# Patient Record
Sex: Male | Born: 1965 | Hispanic: Yes | Marital: Married | Smoking: Former smoker
Health system: Southern US, Community
[De-identification: ages and names within clinical notes are randomized; demographics above are authoritative.]

## PROBLEM LIST (undated history)

## (undated) DIAGNOSIS — K922 Gastrointestinal hemorrhage, unspecified: Secondary | ICD-10-CM

## (undated) DIAGNOSIS — J449 Chronic obstructive pulmonary disease, unspecified: Secondary | ICD-10-CM

## (undated) DIAGNOSIS — I712 Thoracic aortic aneurysm, without rupture, unspecified: Secondary | ICD-10-CM

---

## 2019-01-31 ENCOUNTER — Other Ambulatory Visit: Payer: Self-pay

## 2019-01-31 DIAGNOSIS — Z20822 Contact with and (suspected) exposure to covid-19: Secondary | ICD-10-CM

## 2019-02-02 LAB — NOVEL CORONAVIRUS, NAA: SARS-CoV-2, NAA: NOT DETECTED

## 2019-02-03 ENCOUNTER — Telehealth: Payer: Self-pay | Admitting: *Deleted

## 2019-02-03 NOTE — Telephone Encounter (Signed)
Assisted by [facility], Interpreter # [phone]; pt's wife given result; she verbalized understanding.

## 2019-09-17 ENCOUNTER — Ambulatory Visit: Payer: BC Managed Care – PPO | Admitting: Emergency Medicine

## 2019-09-17 ENCOUNTER — Ambulatory Visit (INDEPENDENT_AMBULATORY_CARE_PROVIDER_SITE_OTHER): Payer: BC Managed Care – PPO

## 2019-09-17 ENCOUNTER — Other Ambulatory Visit: Payer: Self-pay

## 2019-09-17 ENCOUNTER — Encounter: Payer: Self-pay | Admitting: Emergency Medicine

## 2019-09-17 VITALS — BP 120/80 | HR 89 | Temp 98.6°F | Ht 72.0 in | Wt 209.0 lb

## 2019-09-17 DIAGNOSIS — F17200 Nicotine dependence, unspecified, uncomplicated: Secondary | ICD-10-CM

## 2019-09-17 DIAGNOSIS — Z7689 Persons encountering health services in other specified circumstances: Secondary | ICD-10-CM

## 2019-09-17 DIAGNOSIS — Z716 Tobacco abuse counseling: Secondary | ICD-10-CM | POA: Diagnosis not present

## 2019-09-17 DIAGNOSIS — F17209 Nicotine dependence, unspecified, with unspecified nicotine-induced disorders: Secondary | ICD-10-CM

## 2019-09-17 DIAGNOSIS — R634 Abnormal weight loss: Secondary | ICD-10-CM

## 2019-09-17 MED ORDER — BUPROPION HCL ER (SR) 150 MG PO TB12: 150.0000 mg | ORAL_TABLET | Freq: Every day | ORAL | 1 refills | Status: DC

## 2019-09-17 NOTE — Patient Instructions (Addendum)
If you have lab work done today you will be contacted with your lab results within the next 2 weeks.  If you have not heard from [place] then please contact us. The fastest way to get your results is to register for My Chart.   IF you received an x-ray today, you will receive an invoice from [facility]. Please contact [facility] at [phone] with questions or concerns regarding your invoice.   IF you received labwork today, you will receive an invoice from [facility]. Please contact [facility] at [phone] with questions or concerns regarding your invoice.   Our billing staff will not be able to assist you with questions regarding bills from these companies.  You will be contacted with the lab results as soon as they are available. The fastest way to get your results is to activate your My Chart account. Instructions are located on the last page of this paperwork. If you have not heard from [place] regarding the results in 2 weeks, please contact this office.      Mantenimiento de Teacher, English as a foreign language, Male Adoptar un estilo de vida saludable y recibir atencin preventiva son importantes para promover la salud y Musician. Consulte al mdico sobre:  El esquema adecuado para hacerse pruebas y exmenes peridicos.  Cosas que puede hacer por su cuenta para prevenir enfermedades y [place] sano. Qu debo saber sobre la dieta, el peso y el ejercicio? Consuma una dieta saludable   Consuma una dieta que incluya muchas verduras, frutas, productos lcteos con bajo contenido de [place] y Advertising account planner.  No consuma muchos alimentos ricos en grasas slidas, azcares agregados o sodio. Mantenga un peso saludable El ndice de masa muscular [facility]) es una medida que puede utilizarse para identificar posibles problemas de [place]. Proporciona una estimacin de la grasa corporal basndose en el peso y la altura. Su mdico puede ayudarle a Radiation protection practitioner [place] y a  Scientist, forensic o Theatre manager un peso saludable. Haga ejercicio con regularidad Haga ejercicio con regularidad. Esta es una de las prcticas ms importantes que puede hacer por su salud. La mayora de los adultos deben seguir estas pautas:  Optometrist, al menos, 169minutos de actividad fsica por semana. El ejercicio debe aumentar la frecuencia cardaca y Nature conservation officer transpirar (ejercicio de intensidad moderada).  Hacer ejercicios de fortalecimiento por lo [employer] por semana. Agregue esto a su plan de ejercicio de intensidad moderada.  Pasar menos tiempo sentados. Incluso la actividad fsica ligera puede ser beneficiosa. Controle sus niveles de colesterol y lpidos en la sangre Comience a realizarse anlisis de lpidos y Research officer, trade union en la sangre a los 20aos y luego reptalos cada 5aos. Es posible que Automotive engineer los niveles de colesterol con mayor frecuencia si:  Sus niveles de lpidos y colesterol son altos.  Es mayor de 40aos.  Presenta un alto riesgo de padecer enfermedades cardacas. Qu debo saber sobre las pruebas de deteccin del cncer? Muchos tipos de cncer pueden detectarse de manera temprana y, a menudo, pueden prevenirse. Segn su historia clnica y sus antecedentes familiares, es posible que deba realizarse pruebas de deteccin del cncer en diferentes edades. Esto puede incluir pruebas de deteccin de lo siguiente:  Surveyor, minerals.  Cncer de prstata.  Cncer de piel.  Cncer de pulmn. Qu debo saber sobre la enfermedad cardaca, la diabetes y la hipertensin arterial? Presin arterial y enfermedad cardaca  La hipertensin arterial causa enfermedades cardacas y [place] el riesgo de accidente cerebrovascular. Es ms probable  probable que esto se manifieste en las personas que tienen lecturas de presin arterial alta, tienen ascendencia africana o tienen sobrepeso.  Hable con el mdico sobre sus valores de presin arterial deseados.  Hgase controlar la presin  arterial: ? Cada 3 a 5 aos si tiene entre 18 y 39 aos. ? Todos los aos si es mayor de 40aos.  Si tiene entre 65 y 75 aos y es fumador o sola fumar, pregntele al mdico si debe realizarse una prueba de deteccin de aneurisma artico abdominal (AAA) por nica vez. Diabetes Realcese exmenes de deteccin de la diabetes con regularidad. Este anlisis revisa el nivel de azcar en la sangre en ayunas. Hgase las pruebas de deteccin:  Cada tresaos despus de los 45aos de edad si tiene un peso normal y un bajo riesgo de padecer diabetes.  Con ms frecuencia y a partir de una edad inferior si tiene sobrepeso o un alto riesgo de padecer diabetes. Qu debo saber sobre la prevencin de infecciones? Hepatitis B Si tiene un riesgo ms alto de contraer hepatitis B, debe someterse a un examen de deteccin de este virus. Hable con el mdico para averiguar si tiene riesgo de contraer la infeccin por hepatitis B. Hepatitis C Se recomienda un anlisis de sangre para:  Todos los que nacieron entre 1945 y 1965.  Todas las personas que tengan un riesgo de haber contrado hepatitis C. Enfermedades de transmisin sexual (ETS)  Debe realizarse pruebas de deteccin de ITS todos los aos, incluidas la gonorrea y la clamidia, si: ? Es sexualmente activo y es menor de 24aos. ? Es mayor de 24aos, y el mdico le informa que corre riesgo de tener este tipo de infecciones. ? La actividad sexual ha cambiado desde que le hicieron la ltima prueba de deteccin y tiene un riesgo mayor de tener clamidia o gonorrea. Pregntele al mdico si usted tiene riesgo.  Pregntele al mdico si usted tiene un alto riesgo de contraer VIH. El mdico tambin puede recomendarle un medicamento recetado para ayudar a evitar la infeccin por el VIH. Si elige tomar medicamentos para prevenir el VIH, primero debe hacerse los anlisis de deteccin del VIH. Luego debe hacerse anlisis cada 3meses mientras est tomando los  medicamentos. Siga estas instrucciones en su casa: Estilo de vida  No consuma ningn producto que contenga nicotina o tabaco, como cigarrillos, cigarrillos electrnicos y tabaco de mascar. Si necesita ayuda para dejar de fumar, consulte al mdico.  No consuma drogas.  No comparta agujas.  Solicite ayuda a su mdico si necesita apoyo o informacin para abandonar las drogas. Consumo de alcohol  No beba alcohol si el mdico se lo prohbe.  Si bebe alcohol: ? Limite la cantidad que consume de 0 a 2 medidas por da. ? Est atento a la cantidad de alcohol que hay en las bebidas que toma. En los [place], una medida equivale a una botella de cerveza de 12oz (355ml), un vaso de vino de 5oz (148ml) o un vaso de una bebida alcohlica de alta graduacin de 1oz (44ml). Instrucciones generales  Realcese los estudios de rutina de la salud, dentales y de la vista.  Mantngase al da con las vacunas.  Infrmele a su mdico si: ? Se siente deprimido con frecuencia. ? Alguna vez ha sido vctima de maltrato o no se siente seguro en su casa. Resumen  Adoptar un estilo de vida saludable y recibir atencin preventiva son importantes para promover la salud y el bienestar.  Siga las instrucciones del mdico   de una dieta saludable, el ejercicio y la realizacin de pruebas o exmenes para Hotel manager.  Siga las instrucciones del mdico con respecto al control del colesterol y la presin arterial. Esta informacin no tiene Theme park manager el consejo del mdico. Asegrese de hacerle al mdico cualquier pregunta que tenga. Document Revised: 03/27/2018 Document Reviewed: 03/27/2018 Elsevier Patient Education  2020 [employer].  Pasos para dejar de fumar Steps to Quit Smoking Fumar tabaco es la causa principal de muerte prevenible. Puede afectar a casi todos los rganos del cuerpo. Fumar aumenta el riesgo de sufrir muchas enfermedades graves y duraderas (crnicas), no  solo para el fumador, sino para las personas que lo rodean. Dejar de fumar puede ser difcil, pero es una de las mejores cosas que puede hacer por su salud. Nunca es muy tarde para dejar de fumar. Cmo me preparo para dejar de fumar? Cuando decida dejar de fumar, haga un plan que lo ayude a lograrlo. Antes de dejar de fumar, haga lo siguiente:  Elija una fecha para dejar de fumar. Fije una fecha que est dentro de las 2 semanas siguientes, as tiene tiempo para prepararse.  Escriba las razones por las que quiere dejar de Art therapist. Conserve esta lista en lugares donde la vea con frecuencia.  Dgales a sus familiares, amigos y compaeros de trabajo que est dejando de fumar. Su apoyo es importante.  Hable con su mdico sobre las opciones que pueden ayudarlo a dejar de fumar.  Averige si su seguro mdico le cubrir estos tratamientos.  Reconozca cules son las personas, los lugares, las cosas y las actividades que hacen que tenga deseos de fumar (factores desencadenantes). Evtelos. Cules son las primeras medidas que puedo tomar para dejar de fumar?  Deseche todos los cigarrillos que haya en su casa, en el trabajo y en su automvil.  Deseche todo lo que [place] cuando fuma, como ceniceros y encendedores.  Limpie su automvil. Asegrese de Presenter, broadcasting.  Limpie su casa, incluidas las cortinas y las alfombras. Qu puedo hacer que me ayude a dejar de fumar? Hable con su mdico sobre tomar [employer] y ver a Facilities manager psicolgico al [employer]. Es ms probable que tenga xito cuando hace ambas cosas.  Si est embarazada o amamantando, hable con su mdico sobre recibir asesoramiento psicolgico u otras maneras de dejar de fumar. No tome medicamentos para dejar de fumar a menos que el mdico se lo indique. Para dejar de fumar: Dejar de fumar inmediatamente  Deje de fumar por completo, en lugar de reducir lentamente la cantidad que fuma durante un perodo de [place].  Concurra al  asesoramiento psicolgico. Es ms probable que deje de fumar si concurre a las sesiones de asesoramiento psicolgico con regularidad. Tomar medicamentos Puede tomar medicamentos que lo ayuden a dejar de fumar. Algunos medicamentos necesitan receta y otros se pueden comprar sin receta. Algunos medicamentos pueden contener una droga llamada nicotina para reemplazar la nicotina de los cigarrillos. Los medicamentos pueden:  Ayudarlo a Animal nutritionist de sentir deseos de fumar (deseo intenso).  Ayudarlo a [employer] que aparecen cuando deja de fumar (sntomas de abstinencia). El mdico puede indicarle que use:  [place], goma de Theatre manager o parches de nicotina.  Inhaladores o aerosoles con nicotina.  Medicamentos sin nicotina que se toman por va oral. Buscar recursos Busque recursos y otras cosas que lo ayuden a Animal nutritionist de Art therapist, y a no Engineer, manufacturing systems hbito ms adelante. Estos recursos son ms tiles cuando se los [place] con frecuencia. Casandra Doffing  los siguientes:  Charlas en lnea con un consejero.  Lneas telefnicas para dejar de fumar.  Materiales impresos de [place].  Grupos de apoyo o asesoramiento psicolgico grupal.  Programas de mensajes de texto.  Aplicaciones para telfonos mviles. Use aplicaciones en su telfono mvil o tableta que lo ayuden a ajustarse al plan de dejar de fumar. Existen muchas aplicaciones gratuitas para telfonos mviles y tabletas, as Social research officer, government. Algunos ejemplos incluyen Quit Guide (Gua para dejar de fumar), [employer], y Nature conservation officer.gov  Qu puedo hacer para que dejar de fumar sea ms fcil?   Hable con sus familiares y amigos. Pdales que lo apoyen y [place].  Llame a una lnea telefnica que ayude a dejar de fumar (1-800-QUIT-NOW), pngase en contacto con grupos de apoyo o reciba asesoramiento psicolgico.  Pdale a la gente que fuma que no lo haga a su alrededor.  Evite los lugares que le den deseos de fumar, por  ejemplo: ? Bares. ? Fiestas. ? reas para fumar en el trabajo.  Pase tiempo con personas que no fumen.  Disminuya el estrs en su vida. El estrs puede hacer que desee fumar. Pruebe estas cosas para aliviar el estrs: ? Practicar actividad fsica con regularidad. ? Hacer ejercicios de respiracin profunda. ? Yoga. ? Meditar. ? Realizar una visualizacin corporal. Para hacer esto, cierre los ojos y focalcese en una zona del cuerpo a la vez desde la cabeza [employer] dedos del pie. Observe qu partes del cuerpo estn tensas. Trate de relajar los msculos de esas reas. Cmo me sentir cuando deje de fumar? Da 1 hasta 3 semanas En las primeras 24 horas, es posible que comience a tener algunos problemas que provienen de dejar de consumir tabaco. Estos problemas son muy intensos 2 o 3 das despus de dejar de fumar, pero no suelen durar ms de 2 o 3 semanas. Puede tener estos sntomas:  Cambios en el estado de nimo.  Sentirse inquieto, nervioso, enojado o contrariado.  Dificultad para concentrarse.  Mareos.  Fuerte deseo de consumir nicotina y alimentos con alto contenido de International aid/development worker.  Aumento de [place].  Dificultad para defecar (estreimiento).  Ganas de vomitar (nuseas).  Tos o dolor de Advertising copywriter.  Cambios en la forma en que actan en su cuerpo los medicamentos que toma para otros problemas.  Depresin.  Problemas para dormir (insomnio). Semana 3 y posteriormente Despus de 2o 3semanas de haber dejado de fumar, comenzar a notar resultados ms positivos, como los siguientes:  Mejor sentido del olfato y Event organiser.  Menos tos y dolor de Advertising copywriter.  Frecuencia cardaca ms lenta.  Presin arterial ms baja.  Piel ms tersa.  Mejor respiracin.  Menos das de enfermedad. Dejar de fumar puede ser difcil. No se rinda si la primera vez fracasa. Algunas personas necesitan probar algunas veces antes de lograrlo. Haga lo posible por respetar el plan para dejar de fumar y  hable con su mdico si tiene alguna pregunta o inquietud. Resumen  Fumar tabaco es la causa principal de muerte prevenible. Dejar de fumar puede ser difcil, pero es una de las mejores cosas que puede hacer por su salud.  Cuando decida dejar de fumar, haga un plan que lo ayude a lograrlo.  Deje de fumar de inmediato, no lentamente durante un perodo de [place].  Cuando comience a dejar de fumar, busque ayuda de su mdico, su familia o sus amigos. Esta informacin no tiene Theme park manager el consejo del mdico. Asegrese de hacerle al mdico cualquier pregunta que  tenga. Document Revised: 06/25/2018 Document Reviewed: 06/25/2018 Elsevier Patient Education  2020 [employer].

## 2019-09-17 NOTE — Progress Notes (Signed)
Scott Hanna 54 y.o.   Chief Complaint  Patient presents with  . Establish Care    general check up- and test for kidney and lungs     HISTORY OF PRESENT ILLNESS: This is a 54 y.o. male first visit to this office here to establish care with me. Recently moved to the area from [place]. Daily smoker for many years, pack per day.  Willing to stop. Occasional EtOH user. Physically active at work. No history of chronic medical problems.  On no medications. Reports unintentional weight loss over the past several months. No other complaints or medical concerns today.  HPI   Prior to Admission medications   Medication Sig Start Date End Date Taking? Authorizing Provider  aspirin EC 81 MG tablet Take 81 mg by mouth daily. Once he remembers   Yes [provider]    No Known Allergies  There are no problems to display for this patient.   History reviewed. No pertinent past medical history.  History reviewed. No pertinent surgical history.  Social History   Socioeconomic History  . Marital status: Married    Spouse name: Not on file  . Number of children: Not on file  . Years of education: Not on file  . Highest education level: Not on file  Occupational History  . Not on file  Tobacco Use  . Smoking status: Current Every Day Smoker  . Smokeless tobacco: Never Used  Substance and Sexual Activity  . Alcohol use: Not Currently  . Drug use: Never  . Sexual activity: Yes  Other Topics Concern  . Not on file  Social History Narrative  . Not on file   Social Determinants of Health   Financial Resource Strain:   . Difficulty of Paying Living Expenses:   Food Insecurity:   . Worried About Programme researcher, broadcasting/film/video in the Last Year:   . Barista in the Last Year:   Transportation Needs:   . Freight forwarder (Medical):   Marland Kitchen Lack of Transportation (Non-Medical):   Physical Activity:   . Days of Exercise per Week:   . Minutes of  Exercise per Session:   Stress:   . Feeling of Stress :   Social Connections:   . Frequency of Communication with Friends and Family:   . Frequency of Social Gatherings with Friends and Family:   . Attends Religious Services:   . Active Member of Clubs or Organizations:   . Attends Banker Meetings:   Marland Kitchen Marital Status:   Intimate Partner Violence:   . Fear of Current or Ex-Partner:   . Emotionally Abused:   Marland Kitchen Physically Abused:   . Sexually Abused:     History reviewed. No pertinent family history.   Review of Systems  Constitutional: Positive for weight loss. Negative for chills and fever.  HENT: Negative.  Negative for congestion and sore throat.   Respiratory: Negative.  Negative for cough, hemoptysis, sputum production, shortness of breath and wheezing.   Cardiovascular: Negative.  Negative for chest pain and palpitations.  Gastrointestinal: Negative.  Negative for abdominal pain, blood in stool, diarrhea, melena, nausea and vomiting.  Genitourinary: Negative.  Negative for dysuria and hematuria.  Musculoskeletal: Negative.  Negative for back pain, myalgias and neck pain.  Skin: Negative.  Negative for rash.  Neurological: Negative.  Negative for dizziness and headaches.  All other systems reviewed and are negative.  Today's Vitals   09/17/19 0959 09/17/19 1011  BP: Marland Kitchen)  145/95 120/80  Pulse: 89   Temp: 98.6 F (37 C)   TempSrc: Temporal   SpO2: 95%   Weight: 209 lb (94.8 kg)   Height: 6' (1.829 m)    Body mass index is 28.35 kg/m.   Physical Exam Vitals reviewed.  Constitutional:      Appearance: Normal appearance.  HENT:     Head: Normocephalic.     Mouth/Throat:     Mouth: Mucous membranes are moist.     Pharynx: Oropharynx is clear.  Eyes:     Extraocular Movements: Extraocular movements intact.     Conjunctiva/sclera: Conjunctivae normal.     Pupils: Pupils are equal, round, and reactive to light.  Cardiovascular:     Rate and Rhythm:  Normal rate and regular rhythm.     Pulses: Normal pulses.     Heart sounds: Normal heart sounds.  Pulmonary:     Effort: Pulmonary effort is normal.     Breath sounds: Normal breath sounds.  Abdominal:     General: There is no distension.     Palpations: Abdomen is soft. There is no mass.     Tenderness: There is no abdominal tenderness.  Musculoskeletal:        General: Normal range of motion.     Cervical back: Normal range of motion and neck supple.  Skin:    General: Skin is warm and dry.     Capillary Refill: Capillary refill takes less than 2 seconds.  Neurological:     General: No focal deficit present.     Mental Status: He is alert and oriented to person, place, and time.  Psychiatric:        Mood and Affect: Mood normal.        Behavior: Behavior normal.    DG Chest 2 View  Result Date: 09/17/2019 CLINICAL DATA:  Active smoker EXAM: CHEST - 2 VIEW COMPARISON:  None FINDINGS: Normal heart size, mediastinal contours, and pulmonary vascularity. Lungs hyperinflated with minimal peribronchial thickening suggesting COPD. No acute infiltrate, pleural effusion or pneumothorax. Levoconvex upper thoracic scoliosis. IMPRESSION: Hyperinflation and minimal peribronchial thickening question COPD. No acute abnormalities. Electronically Signed   By: Ulyses SouthwardMark  Boles M.D.   On: 09/17/2019 11:05   A total of 45 minutes was spent with the patient, greater than 50% of which was in counseling/coordination of care regarding medical history, need for smoking cessation, risks associated with smoking including cancer and cardiovascular diseases, chest x-ray findings and possibility of COPD, starting new medication Wellbutrin and side effects, diet and nutrition, health maintenance items, prognosis and need for follow-up.   ASSESSMENT & PLAN: [name] was seen today for establish care.  Diagnoses and all orders for this visit:  Loss of weight -     CBC with Differential/Platelet -     Hemoglobin  A1c -     Comprehensive metabolic panel -     TSH -     Lipid panel -     PSA  Current smoker -     DG Chest 2 View  Tobacco use disorder, continuous -     buPROPion (WELLBUTRIN SR) 150 MG 12 hr tablet; Take 1 tablet (150 mg total) by mouth daily.  Encounter for smoking cessation counseling  Encounter to establish care     Patient Instructions       If you have lab work done today you will be contacted with your lab results within the next 2 weeks.  If you have not heard  from [place] then please contact us. The fastest way to get your results is to register for My Chart.   IF you received an x-ray today, you will receive an invoice from [facility]. Please contact [facility] at [phone] with questions or concerns regarding your invoice.   IF you received labwork today, you will receive an invoice from [place]. Please contact [facility] at [phone] with questions or concerns regarding your invoice.   Our billing staff will not be able to assist you with questions regarding bills from these companies.  You will be contacted with the lab results as soon as they are available. The fastest way to get your results is to activate your My Chart account. Instructions are located on the last page of this paperwork. If you have not heard from [place] regarding the results in 2 weeks, please contact this office.      Mantenimiento de Research officer, political party, Male Adoptar un estilo de vida saludable y recibir atencin preventiva son importantes para promover la salud y Counsellor. Consulte al mdico sobre:  El esquema adecuado para hacerse pruebas y exmenes peridicos.  Cosas que puede hacer por su cuenta para prevenir enfermedades y [place] sano. Qu debo saber sobre la dieta, el peso y el ejercicio? Consuma una dieta saludable   Consuma una dieta que incluya muchas verduras, frutas, productos lcteos con bajo contenido de [place] y  Associate Professor.  No consuma muchos alimentos ricos en grasas slidas, azcares agregados o sodio. Mantenga un peso saludable El ndice de masa muscular [facility]) es una medida que puede utilizarse para identificar posibles problemas de [place]. Proporciona una estimacin de la grasa corporal basndose en el peso y la altura. Su mdico puede ayudarle a Engineer, site IMC y a Personnel officer o Pharmacologist un peso saludable. Haga ejercicio con regularidad Haga ejercicio con regularidad. Esta es una de las prcticas ms importantes que puede hacer por su salud. La mayora de los adultos deben seguir estas pautas:  Education officer, environmental, al menos, de actividad fsica por semana. El ejercicio debe aumentar la frecuencia cardaca y Media planner transpirar (ejercicio de intensidad moderada).  Hacer ejercicios de fortalecimiento por lo [employer] por semana. Agregue esto a su plan de ejercicio de intensidad moderada.  Pasar menos tiempo sentados. Incluso la actividad fsica ligera puede ser beneficiosa. Controle sus niveles de colesterol y lpidos en la sangre Comience a realizarse anlisis de lpidos y Oncologist en la sangre a los 20aos y luego reptalos cada 5aos. Es posible que Insurance underwriter los niveles de colesterol con mayor frecuencia si:  Sus niveles de lpidos y colesterol son altos.  Es mayor de 40aos.  Presenta un alto riesgo de padecer enfermedades cardacas. Qu debo saber sobre las pruebas de deteccin del cncer? Muchos tipos de cncer pueden detectarse de manera temprana y, a menudo, pueden prevenirse. Segn su historia clnica y sus antecedentes familiares, es posible que deba realizarse pruebas de deteccin del cncer en diferentes edades. Esto puede incluir pruebas de deteccin de lo siguiente:  Building services engineer.  Cncer de prstata.  Cncer de piel.  Cncer de pulmn. Qu debo saber sobre la enfermedad cardaca, la diabetes y la hipertensin arterial? Presin arterial y  enfermedad cardaca  La hipertensin arterial causa enfermedades cardacas y [place] el riesgo de accidente cerebrovascular. Es ms probable que esto se manifieste en las personas que tienen lecturas de presin arterial alta, tienen ascendencia africana o tienen sobrepeso.  Hable con el mdico sobre sus [place]  de presin arterial deseados.  Hgase controlar la presin arterial: ? Cada 3 a 5 aos si tiene entre 18 y 61 aos. ? Todos los aos si es mayor de [place].  Si tiene entre 65 y 82 aos y es fumador o Insurance underwriter, pregntele al mdico si debe realizarse una prueba de deteccin de aneurisma artico abdominal (AAA) por nica vez. Diabetes Realcese exmenes de deteccin de la diabetes con regularidad. Este anlisis revisa el nivel de azcar en la sangre en [place]. Hgase las pruebas de deteccin:  Cada tresaos despus de los 45aos de edad si tiene un peso normal y un bajo riesgo de padecer diabetes.  Con ms frecuencia y a partir de [place] edad inferior si tiene sobrepeso o un alto riesgo de padecer diabetes. Qu debo saber sobre la prevencin de infecciones? Hepatitis B Si tiene un riesgo ms alto de contraer hepatitis B, debe someterse a un examen de deteccin de este virus. Hable con el mdico para averiguar si tiene riesgo de contraer la infeccin por hepatitis B. Hepatitis C Se recomienda un anlisis de [place] para:  Todos los que nacieron entre 1947 y [phone].  Todas las personas que tengan un riesgo de haber contrado hepatitis C. Enfermedades de transmisin sexual (ETS)  Debe realizarse pruebas de deteccin de ITS todos los aos, incluidas la gonorrea y la clamidia, si: ? Es sexualmente activo y es menor de 24aos. ? Es mayor de 24aos, y Public affairs consultant informa que corre riesgo de tener este tipo de infecciones. ? La actividad sexual ha cambiado desde que le hicieron la ltima prueba de deteccin y tiene un riesgo mayor de Warehouse manager clamidia o Copy. Pregntele al mdico si  usted tiene riesgo.  Pregntele al mdico si usted tiene un alto riesgo de Primary school teacher VIH. El mdico tambin puede recomendarle un medicamento recetado para ayudar a evitar la infeccin por el VIH. Si elige tomar medicamentos para prevenir el VIH, primero debe [employer] de deteccin del VIH. Luego debe hacerse anlisis cada mientras est tomando los medicamentos. Siga estas instrucciones en su casa: Estilo de vida  No consuma ningn producto que contenga nicotina o tabaco, como cigarrillos, cigarrillos electrnicos y tabaco de Theatre manager. Si necesita ayuda para dejar de fumar, consulte al mdico.  No consuma drogas.  No comparta agujas.  Solicite ayuda a su mdico si necesita apoyo o informacin para abandonar las drogas. Consumo de alcohol  No beba alcohol si el mdico se lo prohbe.  Si bebe alcohol: ? Limite la cantidad que consume de 0 a 2 medidas por da. ? Est atento a la cantidad de alcohol que hay en las bebidas que toma. En los [place], una medida equivale a una botella de cerveza de 12oz ( ), un vaso de vino de 5oz ( ) o un vaso de una bebida alcohlica de alta graduacin de 1oz (44ml). Instrucciones generales  Realcese los estudios de rutina de la salud, dentales y de Wellsite geologist.  Mantngase al da con las vacunas.  Infrmele a su mdico si: ? Se siente deprimido con frecuencia. ? Alguna vez ha sido vctima de [place] o no se siente seguro en su casa. Resumen  Adoptar un estilo de vida saludable y recibir atencin preventiva son importantes para promover la salud y Counsellor.  Siga las instrucciones del mdico acerca de una dieta saludable, el ejercicio y la realizacin de pruebas o exmenes para Hotel manager.  Siga las instrucciones del mdico con respecto al control del colesterol y  la presin arterial. Esta informacin no tiene como fin reemplazar el consejo del mdico. Asegrese de hacerle al mdico cualquier pregunta  que tenga. Document Revised: 03/27/2018 Document Reviewed: 03/27/2018 Elsevier Patient Education  2020 [employer].  Pasos para dejar de fumar Steps to Quit Smoking Fumar tabaco es la causa principal de muerte prevenible. Puede afectar a casi todos los rganos del cuerpo. Fumar aumenta el riesgo de sufrir muchas enfermedades graves y duraderas (crnicas), no solo para el fumador, sino para las personas que lo rodean. Dejar de fumar puede ser difcil, pero es una de las mejores cosas que puede hacer por su salud. Nunca es muy tarde para dejar de fumar. Cmo me preparo para dejar de fumar? Cuando decida dejar de fumar, haga un plan que lo ayude a lograrlo. Antes de dejar de fumar, haga lo siguiente:  Elija una fecha para dejar de fumar. Fije una fecha que est dentro de las 2 semanas siguientes, as tiene tiempo para prepararse.  Escriba las razones por las que quiere dejar de Art therapist. Conserve esta lista en lugares donde la vea con frecuencia.  Dgales a sus familiares, amigos y compaeros de trabajo que est dejando de fumar. Su apoyo es importante.  Hable con su mdico sobre las opciones que pueden ayudarlo a dejar de fumar.  Averige si su seguro mdico le cubrir estos tratamientos.  Reconozca cules son las personas, los lugares, las cosas y las actividades que hacen que tenga deseos de fumar (factores desencadenantes). Evtelos. Cules son las primeras medidas que puedo tomar para dejar de fumar?  Deseche todos los cigarrillos que haya en su casa, en el trabajo y en su automvil.  Deseche todo lo que [place] cuando fuma, como ceniceros y encendedores.  Limpie su automvil. Asegrese de Presenter, broadcasting.  Limpie su casa, incluidas las cortinas y las alfombras. Qu puedo hacer que me ayude a dejar de fumar? Hable con su mdico sobre tomar [employer] y ver a Facilities manager psicolgico al [employer]. Es ms probable que tenga xito cuando hace ambas cosas.  Si est embarazada o  amamantando, hable con su mdico sobre recibir asesoramiento psicolgico u otras maneras de dejar de fumar. No tome medicamentos para dejar de fumar a menos que el mdico se lo indique. Para dejar de fumar: Dejar de fumar inmediatamente  Deje de fumar por completo, en lugar de reducir lentamente la cantidad que fuma durante un perodo de [place].  Concurra al asesoramiento psicolgico. Es ms probable que deje de fumar si concurre a las sesiones de asesoramiento psicolgico con regularidad. Tomar medicamentos Puede tomar medicamentos que lo ayuden a dejar de fumar. Algunos medicamentos necesitan receta y otros se pueden comprar sin receta. Algunos medicamentos pueden contener una droga llamada nicotina para reemplazar la nicotina de los cigarrillos. Los medicamentos pueden:  Ayudarlo a Animal nutritionist de sentir deseos de fumar (deseo intenso).  Ayudarlo a [employer] que aparecen cuando deja de fumar (sntomas de abstinencia). El mdico puede indicarle que use:  Pastillas, goma de Theatre manager o parches de nicotina.  Inhaladores o aerosoles con nicotina.  Medicamentos sin nicotina que se toman por va oral. Buscar recursos Busque recursos y otras cosas que lo ayuden a Animal nutritionist de Art therapist, y a no Engineer, manufacturing systems hbito ms adelante. Estos recursos son ms tiles cuando se los [place] con frecuencia. Incluyen los siguientes:  Charlas en lnea con un consejero.  Lneas telefnicas para dejar de fumar.  Materiales impresos de [place].  Grupos de apoyo o asesoramiento psicolgico grupal.  Programas de mensajes de texto.  Aplicaciones para telfonos mviles. Use aplicaciones en su telfono mvil o tableta que lo ayuden a ajustarse al plan de dejar de fumar. Existen muchas aplicaciones gratuitas para telfonos mviles y tabletas, as Social research officer, government. Algunos ejemplos incluyen Quit Guide (Gua para dejar de fumar), [employer], y Nature conservation officer.gov  Qu puedo hacer para que dejar de fumar sea ms  fcil?   Hable con sus familiares y amigos. Pdales que lo apoyen y [place].  Llame a una lnea telefnica que ayude a dejar de fumar (1-800-QUIT-NOW), pngase en contacto con grupos de apoyo o reciba asesoramiento psicolgico.  Pdale a la gente que fuma que no lo haga a su alrededor.  Evite los lugares que le den deseos de fumar, por ejemplo: ? Bares. ? Fiestas. ? reas para fumar en el trabajo.  Pase tiempo con personas que no fumen.  Disminuya el estrs en su vida. El estrs puede hacer que desee fumar. Pruebe estas cosas para aliviar el estrs: ? Practicar actividad fsica con regularidad. ? Hacer ejercicios de respiracin profunda. ? Yoga. ? Meditar. ? Realizar una visualizacin corporal. Para hacer esto, cierre los ojos y focalcese en una zona del cuerpo a la vez desde la cabeza [employer] dedos del pie. Observe qu partes del cuerpo estn tensas. Trate de relajar los msculos de esas reas. Cmo me sentir cuando deje de fumar? Da 1 hasta 3 semanas En las primeras 24 horas, es posible que comience a tener algunos problemas que provienen de dejar de consumir tabaco. Estos problemas son muy intensos 2 o 3 das despus de dejar de fumar, pero no suelen durar ms de 2 o 3 semanas. Puede tener estos sntomas:  Cambios en el estado de nimo.  Sentirse inquieto, nervioso, enojado o contrariado.  Dificultad para concentrarse.  Mareos.  Fuerte deseo de consumir nicotina y alimentos con alto contenido de International aid/development worker.  Aumento de [place].  Dificultad para defecar (estreimiento).  Ganas de vomitar (nuseas).  Tos o dolor de Advertising copywriter.  Cambios en la forma en que actan en su cuerpo los medicamentos que toma para otros problemas.  Depresin.  Problemas para dormir (insomnio). Semana 3 y posteriormente Despus de 2o 3semanas de haber dejado de fumar, comenzar a notar resultados ms positivos, como los siguientes:  Mejor sentido del olfato y Event organiser.  Menos tos y dolor  de Advertising copywriter.  Frecuencia cardaca ms lenta.  Presin arterial ms baja.  Piel ms tersa.  Mejor respiracin.  Menos das de enfermedad. Dejar de fumar puede ser difcil. No se rinda si la primera vez fracasa. Algunas personas necesitan probar algunas veces antes de lograrlo. Haga lo posible por respetar el plan para dejar de fumar y hable con su mdico si tiene alguna pregunta o inquietud. Resumen  Fumar tabaco es la causa principal de muerte prevenible. Dejar de fumar puede ser difcil, pero es una de las mejores cosas que puede hacer por su salud.  Cuando decida dejar de fumar, haga un plan que lo ayude a lograrlo.  Deje de fumar de inmediato, no lentamente durante un perodo de [place].  Cuando comience a dejar de fumar, busque ayuda de su mdico, su familia o sus amigos. Esta informacin no tiene Theme park manager el consejo del mdico. Asegrese de hacerle al mdico cualquier pregunta que tenga. Document Revised: 06/25/2018 Document Reviewed: 06/25/2018 Elsevier Patient Education  2020 Elsevier Inc.      Edwina Barth, MD [facility]

## 2019-09-18 ENCOUNTER — Other Ambulatory Visit: Payer: Self-pay | Admitting: Emergency Medicine

## 2019-09-18 ENCOUNTER — Telehealth: Payer: Self-pay | Admitting: *Deleted

## 2019-09-18 DIAGNOSIS — E785 Hyperlipidemia, unspecified: Secondary | ICD-10-CM

## 2019-09-18 LAB — CBC WITH DIFFERENTIAL/PLATELET
Basophils Absolute: 0.1 10*3/uL (ref 0.0–0.2)
Basos: 1 %
EOS (ABSOLUTE): 0.2 10*3/uL (ref 0.0–0.4)
Eos: 2 %
Hematocrit: 50.3 % (ref 37.5–51.0)
Hemoglobin: 16.6 g/dL (ref 13.0–17.7)
Immature Grans (Abs): 0 10*3/uL (ref 0.0–0.1)
Immature Granulocytes: 0 %
Lymphocytes Absolute: 2.8 10*3/uL (ref 0.7–3.1)
Lymphs: 34 %
MCH: 27.9 pg (ref 26.6–33.0)
MCHC: 33 g/dL (ref 31.5–35.7)
MCV: 85 fL (ref 79–97)
Monocytes Absolute: 1 10*3/uL — ABNORMAL HIGH (ref 0.1–0.9)
Monocytes: 11 %
Neutrophils Absolute: 4.3 10*3/uL (ref 1.4–7.0)
Neutrophils: 52 %
Platelets: 278 10*3/uL (ref 150–450)
RBC: 5.94 x10E6/uL — ABNORMAL HIGH (ref 4.14–5.80)
RDW: 12.6 % (ref 11.6–15.4)
WBC: 8.4 10*3/uL (ref 3.4–10.8)

## 2019-09-18 LAB — COMPREHENSIVE METABOLIC PANEL
ALT: 26 IU/L (ref 0–44)
AST: 17 IU/L (ref 0–40)
Albumin/Globulin Ratio: 1.4 (ref 1.2–2.2)
Albumin: 4.2 g/dL (ref 3.8–4.9)
Alkaline Phosphatase: 88 IU/L (ref 48–121)
BUN/Creatinine Ratio: 12 (ref 9–20)
BUN: 10 mg/dL (ref 6–24)
Bilirubin Total: 0.5 mg/dL (ref 0.0–1.2)
CO2: 25 mmol/L (ref 20–29)
Calcium: 9.6 mg/dL (ref 8.7–10.2)
Chloride: 102 mmol/L (ref 96–106)
Creatinine, Ser: 0.82 mg/dL (ref 0.76–1.27)
GFR calc Af Amer: 115 mL/min/{1.73_m2} (ref >59)
GFR calc non Af Amer: 100 mL/min/{1.73_m2} (ref >59)
Globulin, Total: 3.1 g/dL (ref 1.5–4.5)
Glucose: 94 mg/dL (ref 65–99)
Potassium: 4.7 mmol/L (ref 3.5–5.2)
Sodium: 142 mmol/L (ref 134–144)
Total Protein: 7.3 g/dL (ref 6.0–8.5)

## 2019-09-18 LAB — LIPID PANEL
Chol/HDL Ratio: 5.7 ratio — ABNORMAL HIGH (ref 0.0–5.0)
Cholesterol, Total: 200 mg/dL — ABNORMAL HIGH (ref 100–199)
HDL: 35 mg/dL — ABNORMAL LOW (ref >39)
LDL Chol Calc (NIH): 122 mg/dL — ABNORMAL HIGH (ref 0–99)
Triglycerides: 245 mg/dL — ABNORMAL HIGH (ref 0–149)
VLDL Cholesterol Cal: 43 mg/dL — ABNORMAL HIGH (ref 5–40)

## 2019-09-18 LAB — TSH: TSH: 1.33 u[IU]/mL (ref 0.450–4.500)

## 2019-09-18 LAB — PSA: Prostate Specific Ag, Serum: 1.1 ng/mL (ref 0.0–4.0)

## 2019-09-18 LAB — HEMOGLOBIN A1C
Est. average glucose Bld gHb Est-mCnc: 114 mg/dL
Hgb A1c MFr Bld: 5.6 % (ref 4.8–5.6)

## 2019-09-18 MED ORDER — ROSUVASTATIN CALCIUM 20 MG PO TABS: 20.0000 mg | ORAL_TABLET | Freq: Every day | ORAL | 3 refills | Status: DC

## 2019-09-18 NOTE — Telephone Encounter (Signed)
Called patient's mobile and home phone numbers using Interpreter [phone], unable to leave voice message because mail box not set up for the numbers. Also, no additional numbers to contact patient.

## 2019-09-19 ENCOUNTER — Telehealth: Payer: Self-pay

## 2019-09-19 ENCOUNTER — Other Ambulatory Visit: Payer: Self-pay

## 2019-09-19 DIAGNOSIS — E785 Hyperlipidemia, unspecified: Secondary | ICD-10-CM

## 2019-09-19 DIAGNOSIS — F17209 Nicotine dependence, unspecified, with unspecified nicotine-induced disorders: Secondary | ICD-10-CM

## 2019-09-19 MED ORDER — ROSUVASTATIN CALCIUM 20 MG PO TABS: 20.0000 mg | ORAL_TABLET | Freq: Every day | ORAL | 3 refills | Status: DC

## 2019-09-19 MED ORDER — BUPROPION HCL ER (SR) 150 MG PO TB12: 150.0000 mg | ORAL_TABLET | Freq: Every day | ORAL | 1 refills | Status: DC

## 2019-11-27 ENCOUNTER — Ambulatory Visit: Payer: BC Managed Care – PPO | Admitting: Emergency Medicine

## 2019-11-27 ENCOUNTER — Encounter: Payer: Self-pay | Admitting: Emergency Medicine

## 2019-11-27 ENCOUNTER — Other Ambulatory Visit: Payer: Self-pay

## 2019-11-27 VITALS — BP 132/77 | HR 80 | Temp 98.1°F | Resp 16 | Ht 72.0 in | Wt 214.0 lb

## 2019-11-27 DIAGNOSIS — E785 Hyperlipidemia, unspecified: Secondary | ICD-10-CM

## 2019-11-27 DIAGNOSIS — Z1211 Encounter for screening for malignant neoplasm of colon: Secondary | ICD-10-CM

## 2019-11-27 DIAGNOSIS — Z87891 Personal history of nicotine dependence: Secondary | ICD-10-CM

## 2019-11-27 NOTE — Progress Notes (Signed)
Scott Hanna 54 y.o.   Chief Complaint  Patient presents with  . Back Pain    lower - frequently long time per patient, and has stopped smoking    HISTORY OF PRESENT ILLNESS: This is a 54 y.o. male here for follow-up of visit last June. Stopped smoking.  Was started on Wellbutrin.  Doing very well.  Feels great. Blood work showed dyslipidemia.  Started on Crestor 20 mg daily.  Compliant with medication.  No side effects. Gets occasional left-sided mid back pain on and off for many years.  Not a significant concern for him. No longer having increase dark bronchial secretions. Gaining weight. No other complaints or medical concerns today. Recent blood work results reviewed with patient.  Normal PSA and TSH, normal CBC, normal CMP.  Abnormal lipid profile. The 10-year ASCVD risk score [name] DC Montez Hageman., et al., 2013) is: 8%   Values used to calculate the score:     Age: 50 years     Sex: Male     Is Non-Hispanic African American: No     Diabetic: No     Tobacco smoker: No     Systolic Blood Pressure: 132 mmHg     Is BP treated: No     HDL Cholesterol: 35 mg/dL     Total Cholesterol: 200 mg/dL   HPI   Prior to Admission medications   Medication Sig Start Date End Date Taking? Authorizing Provider  buPROPion (WELLBUTRIN SR) 150 MG 12 hr tablet Take 1 tablet (150 mg total) by mouth daily. 09/19/19 12/18/19 Yes Maynor Mwangi, Eilleen Kempf, MD  rosuvastatin (CRESTOR) 20 MG tablet Take 1 tablet (20 mg total) by mouth daily. 09/19/19  Yes Georgina Quint, MD  aspirin EC 81 MG tablet Take 81 mg by mouth daily. Once he remembers Patient not taking: Reported on 11/27/2019    [provider]    No Known Allergies  There are no problems to display for this patient.   History reviewed. No pertinent past medical history.  History reviewed. No pertinent surgical history.  Social History   Socioeconomic History  . Marital status: Married    Spouse name: Not on file  .  Number of children: Not on file  . Years of education: Not on file  . Highest education level: Not on file  Occupational History  . Not on file  Tobacco Use  . Smoking status: Former Games developer  . Smokeless tobacco: Never Used  Substance and Sexual Activity  . Alcohol use: Not Currently  . Drug use: Never  . Sexual activity: Yes  Other Topics Concern  . Not on file  Social History Narrative  . Not on file   Social Determinants of Health   Financial Resource Strain:   . Difficulty of Paying Living Expenses: Not on file  Food Insecurity:   . Worried About Programme researcher, broadcasting/film/video in the Last Year: Not on file  . Ran Out of Food in the Last Year: Not on file  Transportation Needs:   . Lack of Transportation (Medical): Not on file  . Lack of Transportation (Non-Medical): Not on file  Physical Activity:   . Days of Exercise per Week: Not on file  . Minutes of Exercise per Session: Not on file  Stress:   . Feeling of Stress : Not on file  Social Connections:   . Frequency of Communication with Friends and Family: Not on file  . Frequency of Social Gatherings with Friends and Family: Not  on file  . Attends Religious Services: Not on file  . Active Member of Clubs or Organizations: Not on file  . Attends Banker Meetings: Not on file  . Marital Status: Not on file  Intimate Partner Violence:   . Fear of Current or Ex-Partner: Not on file  . Emotionally Abused: Not on file  . Physically Abused: Not on file  . Sexually Abused: Not on file    History reviewed. No pertinent family history.   Review of Systems  Constitutional: Negative.  Negative for chills and fever.  HENT: Negative.  Negative for congestion and sore throat.   Respiratory: Negative.  Negative for cough and shortness of breath.   Cardiovascular: Negative.  Negative for chest pain and palpitations.  Gastrointestinal: Negative.  Negative for abdominal pain, diarrhea, nausea and vomiting.  Genitourinary:  Negative.  Negative for dysuria and hematuria.  Musculoskeletal: Negative.  Negative for back pain, myalgias and neck pain.  Skin: Negative.  Negative for rash.  Neurological: Negative.  Negative for dizziness and headaches.  All other systems reviewed and are negative.  Today's Vitals   11/27/19 0818  BP: 132/77  Pulse: 80  Resp: 16  Temp: 98.1 F (36.7 C)  TempSrc: Temporal  SpO2: 96%  Weight: 214 lb (97.1 kg)  Height: 6' (1.829 m)   Body mass index is 29.02 kg/m. Wt Readings from Last 3 Encounters:  11/27/19 214 lb (97.1 kg)  09/17/19 209 lb (94.8 kg)     Physical Exam Vitals reviewed.  Constitutional:      Appearance: Normal appearance.  HENT:     Head: Normocephalic.  Eyes:     Extraocular Movements: Extraocular movements intact.     Pupils: Pupils are equal, round, and reactive to light.  Cardiovascular:     Rate and Rhythm: Normal rate and regular rhythm.     Pulses: Normal pulses.     Heart sounds: Normal heart sounds.  Pulmonary:     Effort: Pulmonary effort is normal.     Breath sounds: Normal breath sounds.  Musculoskeletal:        General: No tenderness. Normal range of motion.     Cervical back: Normal, normal range of motion and neck supple.     Thoracic back: Normal.     Lumbar back: Normal.  Lymphadenopathy:     Cervical: No cervical adenopathy.  Skin:    General: Skin is warm and dry.     Capillary Refill: Capillary refill takes less than 2 seconds.  Neurological:     General: No focal deficit present.     Mental Status: He is alert and oriented to person, place, and time.  Psychiatric:        Mood and Affect: Mood normal.        Behavior: Behavior normal.      ASSESSMENT & PLAN: Kavontae was seen today for back pain.  Diagnoses and all orders for this visit:  Dyslipidemia  Colon cancer screening -     Ambulatory referral to Gastroenterology  Former smoker     Patient Instructions       If you have lab work done today  you will be contacted with your lab results within the next 2 weeks.  If you have not heard from [place] then please contact us. The fastest way to get your results is to register for My Chart.   IF you received an x-ray today, you will receive an invoice from [facility]. Please contact [facility]  Radiology at [phone] with questions or concerns regarding your invoice.   IF you received labwork today, you will receive an invoice from [facility]. Please contact [facility] at [phone] with questions or concerns regarding your invoice.   Our billing staff will not be able to assist you with questions regarding bills from these companies.  You will be contacted with the lab results as soon as they are available. The fastest way to get your results is to activate your My Chart account. Instructions are located on the last page of this paperwork. If you have not heard from [place] regarding the results in 2 weeks, please contact this office.     Mantenimiento de Research officer, political party, Male Adoptar un estilo de vida saludable y recibir atencin preventiva son importantes para promover la salud y Counsellor. Consulte al mdico sobre:  El esquema adecuado para hacerse pruebas y exmenes peridicos.  Cosas que puede hacer por su cuenta para prevenir enfermedades y [place] sano. Qu debo saber sobre la dieta, el peso y el ejercicio? Consuma una dieta saludable   Consuma una dieta que incluya muchas verduras, frutas, productos lcteos con bajo contenido de [place] y Associate Professor.  No consuma muchos alimentos ricos en grasas slidas, azcares agregados o sodio. Mantenga un peso saludable El ndice de masa muscular [facility]) es una medida que puede utilizarse para identificar posibles problemas de [place]. Proporciona una estimacin de la grasa corporal basndose en el peso y la altura. Su mdico puede ayudarle a Engineer, site [facility]MC y a Personnel officer o Pharmacologist un peso saludable. Haga  ejercicio con regularidad Haga ejercicio con regularidad. Esta es una de las prcticas ms importantes que puede hacer por su salud. La mayora de los adultos deben seguir estas pautas:  Education officer, environmental, al menos, de actividad fsica por semana. El ejercicio debe aumentar la frecuencia cardaca y Media planner transpirar (ejercicio de intensidad moderada).  Hacer ejercicios de fortalecimiento por lo [employer] por semana. Agregue esto a su plan de ejercicio de intensidad moderada.  Pasar menos tiempo sentados. Incluso la actividad fsica ligera puede ser beneficiosa. Controle sus niveles de colesterol y lpidos en la sangre Comience a realizarse anlisis de lpidos y Oncologist en la sangre a los 20aos y luego reptalos cada 5aos. Es posible que Insurance underwriter los niveles de colesterol con mayor frecuencia si:  Sus niveles de lpidos y colesterol son altos.  Es mayor de 40aos.  Presenta un alto riesgo de padecer enfermedades cardacas. Qu debo saber sobre las pruebas de deteccin del cncer? Muchos tipos de cncer pueden detectarse de manera temprana y, a menudo, pueden prevenirse. Segn su historia clnica y sus antecedentes familiares, es posible que deba realizarse pruebas de deteccin del cncer en diferentes edades. Esto puede incluir pruebas de deteccin de lo siguiente:  Building services engineer.  Cncer de prstata.  Cncer de piel.  Cncer de pulmn. Qu debo saber sobre la enfermedad cardaca, la diabetes y la hipertensin arterial? Presin arterial y enfermedad cardaca  La hipertensin arterial causa enfermedades cardacas y [place] el riesgo de accidente cerebrovascular. Es ms probable que esto se manifieste en las personas que tienen lecturas de presin arterial alta, tienen ascendencia africana o tienen sobrepeso.  Hable con el mdico sobre sus valores de presin arterial deseados.  Hgase controlar la presin arterial: ? Cada 3 a 5 aos si tiene entre 18  y 97 aos. ? Todos los aos si es mayor de [place].  Si tiene entre 65 y 70  aos y es fumador o sola fumar, pregntele al mdico si debe realizarse una prueba de deteccin de aneurisma artico abdominal (AAA) por nica vez. Diabetes Realcese exmenes de deteccin de la diabetes con regularidad. Este anlisis revisa el nivel de azcar en la sangre en [place]. Hgase las pruebas de deteccin:  Cada tresaos despus de los 45aos de edad si tiene un peso normal y un bajo riesgo de padecer diabetes.  Con ms frecuencia y a partir de [place] edad inferior si tiene sobrepeso o un alto riesgo de padecer diabetes. Qu debo saber sobre la prevencin de infecciones? Hepatitis B Si tiene un riesgo ms alto de contraer hepatitis B, debe someterse a un examen de deteccin de este virus. Hable con el mdico para averiguar si tiene riesgo de contraer la infeccin por hepatitis B. Hepatitis C Se recomienda un anlisis de [place] para:  Todos los que nacieron entre 1947 y [id number].  Todas las personas que tengan un riesgo de haber contrado hepatitis C. Enfermedades de transmisin sexual (ETS)  Debe realizarse pruebas de deteccin de ITS todos los aos, incluidas la gonorrea y la clamidia, si: ? Es sexualmente activo y es menor de 24aos. ? Es mayor de 24aos, y Public affairs consultant informa que corre riesgo de tener este tipo de infecciones. ? La actividad sexual ha cambiado desde que le hicieron la ltima prueba de deteccin y tiene un riesgo mayor de Warehouse manager clamidia o Copy. Pregntele al mdico si usted tiene riesgo.  Pregntele al mdico si usted tiene un alto riesgo de Primary school teacher VIH. El mdico tambin puede recomendarle un medicamento recetado para ayudar a evitar la infeccin por el VIH. Si elige tomar medicamentos para prevenir el VIH, primero debe [employer]OK de deteccin del VIH. Luego debe hacerse anlisis cada mientras est tomando los medicamentos. Siga estas instrucciones en su  casa: Estilo de vida  No consuma ningn producto que contenga nicotina o tabaco, como cigarrillos, cigarrillos electrnicos y tabaco de Theatre manager. Si necesita ayuda para dejar de fumar, consulte al mdico.  No consuma drogas.  No comparta agujas.  Solicite ayuda a su mdico si necesita apoyo o informacin para abandonar las drogas. Consumo de alcohol  No beba alcohol si el mdico se lo prohbe.  Si bebe alcohol: ? Limite la cantidad que consume de 0 a 2 medidas por da. ? Est atento a la cantidad de alcohol que hay en las bebidas que toma. En los [place], una medida equivale a una botella de cerveza de 12oz ( ), un vaso de vino de 5oz ( ) o un vaso de una bebida alcohlica de alta graduacin de 1oz (86ml). Instrucciones generales  Realcese los estudios de rutina de la salud, dentales y de Wellsite geologist.  Mantngase al da con las vacunas.  Infrmele a su mdico si: ? Se siente deprimido con frecuencia. ? Alguna vez ha sido vctima de [place] o no se siente seguro en su casa. Resumen  Adoptar un estilo de vida saludable y recibir atencin preventiva son importantes para promover la salud y Counsellor.  Siga las instrucciones del mdico acerca de una dieta saludable, el ejercicio y la realizacin de pruebas o exmenes para Hotel manager.  Siga las instrucciones del mdico con respecto al control del colesterol y la presin arterial. Esta informacin no tiene Theme park manager el consejo del mdico. Asegrese de hacerle al mdico cualquier pregunta que tenga. Document Revised: 03/27/2018 Document Reviewed: 03/27/2018 Elsevier Patient Education  2020 Elsevier Inc.  Dislipidemia Dyslipidemia  La dislipidemia es un desequilibrio de sustancias cerosas parecidas a la grasa (lpidos) en la sangre. El cuerpo necesita lpidos en pequeas cantidades. Con frecuencia, la dislipidemia implica un nivel alto de colesterol o triglicridos, que son tipos de  lpidos. Las formas frecuentes de dislipidemia incluyen las siguientes:  Niveles elevados de colesterol LDL. El LDL es el tipo de colesterol que causa la acumulacin de depsitos de grasa (placas) en los vasos sanguneos que transportan la sangre fuera del corazn (arterias).  Niveles bajos de colesterol HDL. El HDL es el tipo de colesterol que brinda proteccin contra las enfermedades cardacas. Los niveles altos de HDL eliminan la acumulacin de LDL de las arterias.  Niveles altos de triglicridos. Los triglicridos son Neomia Dear sustancia grasa presente en la sangre que se relaciona con la acumulacin de placa en las arterias. Cules son las causas? La dislipidemia primaria es causada por cambios (mutaciones) en los genes que se transmiten a travs de las familias (se heredan). Estas mutaciones causan varios tipos de dislipidemia. La dislipidemia secundaria es causada por elecciones de estilos de vida y enfermedades que ocasionan dislipidemia, tales como:  Seguir una dieta rica en grasa de origen animal.  No hacer suficiente ejercicio fsico.  Tener diabetes, enfermedad renal, enfermedad heptica o enfermedad tiroidea.  Beber grandes cantidades de alcohol.  Tomar ciertos medicamentos. Qu incrementa el riesgo? Tiene ms probabilidades de Aeronautical engineer afeccin si es un hombre mayor o si es una mujer que ha pasado por la menopausia. Otros factores de riesgo son los siguientes:  Tener antecedentes familiares de dislipidemia.  Tomar determinados medicamentos, entre ellos, pldoras anticonceptivas, corticoesteroides, algunos diurticos y betabloqueantes.  Fumar cigarrillos.  Consumir una dieta rica en grasas.  Tener ciertas afecciones mdicas, como diabetes, sndrome de ovario poliqustico (SOP), enfermedad renal, enfermedad heptica o hipotiroidismo.  No hacer ejercicio regularmente.  Tener sobrepeso o ser obeso con demasiada grasa en el abdomen. Cules son los signos o los  sntomas? En la [employer], la dislipidemia no causa ningn sntoma. En los [place] graves, los niveles muy altos de lpidos pueden causar:  Protuberancias de grasa debajo de la piel (xantomas).  Un anillo blanco o gris alrededor del centro negro (pupila) del ojo. Los niveles muy altos de triglicridos pueden causar inflamacin del pncreas (pancreatitis). Cmo se diagnostica? Su mdico puede diagnosticar dislipidemia basndose en un anlisis de sangre de rutina (anlisis de sangre en [place]). Como la [employer]son de las personas no tienen sntomas de la afeccin, este anlisis de sangre (perfil de lpidos) se realiza en adultos mayores de 20 aos y se repite cada 5 aos. En este anlisis, se controla lo siguiente:  Colesterol total. Esto mide la cantidad total de colesterol en la sangre, que incluye el colesterol LDL, el colesterol HDL y los triglicridos. Un valor saludable es inferior a 200.  Colesterol LDL. El valor objetivo de colesterol LDL es diferente para cada persona, en funcin de los factores de riesgo individuales. Consulte al mdico cul debe ser el valor del colesterol LDL para usted.  Colesterol HDL. Un nivel de colesterol HDL de 60 o superior es lo mejor porque ayuda a Health visitor las enfermedades cardacas. Un valor inferior a 40 en los hombres o inferior a 50 en las mujeres aumenta el riesgo de enfermedades cardacas.  Triglicridos. Un valor saludable de triglicridos es inferior a 150. Si su perfil de lpidos es anormal, su mdico puede realizar otros anlisis de [place]. Cmo se trata? El tratamiento depende del tipo de dislipidemia que  usted tenga y sus otros factores de riesgo de enfermedades cardacas o accidente cerebrovascular. Su mdico tendr un rango objetivo para sus niveles de lpidos en funcin de esta informacin. Para muchas personas, esta afeccin puede tratarse con cambios en el estilo de vida, tales como dieta y ejercicio. El mdico podra  recomendarle que haga lo siguiente:  Hacer ejercicio con regularidad.  Realizar cambios en la dieta.  Si fuma, dejar de hacerlo. Si los cambios en la dieta y la actividad fsica no ayudan a Baristaalcanzar sus objetivos, el mdico tambin puede recetarle medicamentos para disminuir los lpidos. El tipo de medicamento recetado con ms frecuencia disminuye el colesterol LDL (estatinas). Si tiene Publishing copyun nivel alto de triglicridos, su mdico puede recetarle otro tipo de frmaco (fibratos) o un suplemento de aceite de pescado con omega-3, o ambos. Siga estas indicaciones en su casa:  Comida y bebida  Siga las indicaciones del mdico o el nutricionista respecto de las restricciones para las comidas o las bebidas.  Siga una dieta saludable como se lo haya indicado el mdico. Esto puede ayudarle a Baristaalcanzar y Pharmacologistmantener un peso saludable, reducir el colesterol LDL y aumentar el colesterol HDL. Puede incluir: ? Limitar sus caloras, si tiene sobrepeso. ? Comer ms frutas, verduras, cereales integrales, pescado y carnes magras. ? Limitar las grasas saturadas, las grasas trans y Print production plannerel colesterol.  Si bebe alcohol: ? Limite la cantidad que Box Elderutiliza. ? Est atento a la cantidad de alcohol que hay en las bebidas que toma. En los [place], una medida equivale a una botella de cerveza de 12oz (355ml), un vaso de vino de 5oz (148ml) o un vaso de una bebida alcohlica de alta graduacin de 1oz (44ml).  No beba alcohol si: ? Su mdico le indica no hacerlo. ? Est embarazada, puede estar embarazada o est tratando de quedar embarazada. Actividad  Haga ejercicio con regularidad. Siga un programa de ejercicio y entrenamiento de fuerza tal como se lo haya indicado el mdico. Pregntele al mdico qu actividades son seguras para usted. El mdico puede recomendarle lo siguiente: ? 30 minutos de Kenyaactividad aerbica de 4 a 6 das por [address]semana. La caminata a paso ligero es un ejemplo de [place]seyactividad  aerbica. ? Entrenamiento de fuerza 2 Eli Lilly and Companydas por semana. Indicaciones generales  No consuma ningn producto que contenga nicotina o tabaco, como cigarrillos, cigarrillos electrnicos y tabaco de Theatre managermascar. Si necesita ayuda para dejar de fumar, consulte al mdico.  Baxter Internationalome los medicamentos de venta libre y los recetados solamente como se lo haya indicado el mdico. Esto incluye los suplementos.  Concurra a todas las visitas de seguimiento como se lo haya indicado el mdico. Comunquese con un mdico si:  Usted: ? Tiene dificultad para cumplir con su plan de actividad fsica o dieta. ? Le cuesta dejar de fumar o controlar el consumo de alcohol. Resumen  Con frecuencia, la dislipidemia implica un nivel alto de colesterol o triglicridos, que son tipos de lpidos.  El tratamiento depende del tipo de dislipidemia que usted tenga y sus otros factores de riesgo de enfermedades cardacas o accidente cerebrovascular.  Para muchas personas, el tratamiento comienza con cambios en el estilo de vida, tales como dieta y ejercicio.  Su mdico puede recetarle medicamentos para disminuir los lpidos. Esta informacin no tiene Theme park managercomo fin reemplazar el consejo del mdico. Asegrese de hacerle al mdico cualquier pregunta que tenga. Document Revised: 11/07/2017 Document Reviewed: 11/07/2017 Elsevier Patient Education  2020 Elsevier Inc.      Edwina BarthMiguel Wanza Szumski, MD [facility]

## 2019-11-27 NOTE — Patient Instructions (Addendum)
   If you have lab work done today you will be contacted with your lab results within the next 2 weeks.  If you have not heard from us then please contact us. The fastest way to get your results is to register for My Chart.   IF you received an x-ray today, you will receive an invoice from [facility]. Please contact [facility] at [phone] with questions or concerns regarding your invoice.   IF you received labwork today, you will receive an invoice from [facility]. Please contact [facility] at [phone] with questions or concerns regarding your invoice.   Our billing staff will not be able to assist you with questions regarding bills from these companies.  You will be contacted with the lab results as soon as they are available. The fastest way to get your results is to activate your My Chart account. Instructions are located on the last page of this paperwork. If you have not heard from us regarding the results in 2 weeks, please contact this office.       Mantenimiento de la salud en los hombres Health Maintenance, Male Adoptar un estilo de vida saludable y recibir atencin preventiva son importantes para promover la salud y el bienestar. Consulte al mdico sobre:  El esquema adecuado para hacerse pruebas y exmenes peridicos.  Cosas que puede hacer por su cuenta para prevenir enfermedades y mantenerse sano. Qu debo saber sobre la dieta, el peso y el ejercicio? Consuma una dieta saludable   Consuma una dieta que incluya muchas verduras, frutas, productos lcteos con bajo contenido de grasa y protenas magras.  No consuma muchos alimentos ricos en grasas slidas, azcares agregados o sodio. Mantenga un peso saludable El ndice de masa muscular (IMC) es una medida que puede utilizarse para identificar posibles problemas de peso. Proporciona una estimacin de la grasa corporal basndose en el peso y la altura. Su mdico puede ayudarle a determinar su IMC y  a lograr o mantener un peso saludable. Haga ejercicio con regularidad Haga ejercicio con regularidad. Esta es una de las prcticas ms importantes que puede hacer por su salud. La mayora de los adultos deben seguir estas pautas:  Realizar, al menos, 150minutos de actividad fsica por semana. El ejercicio debe aumentar la frecuencia cardaca y hacerlo transpirar (ejercicio de intensidad moderada).  Hacer ejercicios de fortalecimiento por lo menos dos veces por semana. Agregue esto a su plan de ejercicio de intensidad moderada.  Pasar menos tiempo sentados. Incluso la actividad fsica ligera puede ser beneficiosa. Controle sus niveles de colesterol y lpidos en la sangre Comience a realizarse anlisis de lpidos y colesterol en la sangre a los 20aos y luego reptalos cada 5aos. Es posible que necesite controlar los niveles de colesterol con mayor frecuencia si:  Sus niveles de lpidos y colesterol son altos.  Es mayor de 40aos.  Presenta un alto riesgo de padecer enfermedades cardacas. Qu debo saber sobre las pruebas de deteccin del cncer? Muchos tipos de cncer pueden detectarse de manera temprana y, a menudo, pueden prevenirse. Segn su historia clnica y sus antecedentes familiares, es posible que deba realizarse pruebas de deteccin del cncer en diferentes edades. Esto puede incluir pruebas de deteccin de lo siguiente:  Cncer colorrectal.  Cncer de prstata.  Cncer de piel.  Cncer de pulmn. Qu debo saber sobre la enfermedad cardaca, la diabetes y la hipertensin arterial? Presin arterial y enfermedad cardaca  La hipertensin arterial causa enfermedades cardacas y aumenta el riesgo de accidente cerebrovascular. Es ms   probable que esto se manifieste en las personas que tienen lecturas de presin arterial alta, tienen ascendencia africana o tienen sobrepeso.  Hable con el mdico sobre sus valores de presin arterial deseados.  Hgase controlar la presin  arterial: ? Cada 3 a 5 aos si tiene entre 18 y 39 aos. ? Todos los aos si es mayor de 40aos.  Si tiene entre 65 y 75 aos y es fumador o sola fumar, pregntele al mdico si debe realizarse una prueba de deteccin de aneurisma artico abdominal (AAA) por nica vez. Diabetes Realcese exmenes de deteccin de la diabetes con regularidad. Este anlisis revisa el nivel de azcar en la sangre en ayunas. Hgase las pruebas de deteccin:  Cada tresaos despus de los 45aos de edad si tiene un peso normal y un bajo riesgo de padecer diabetes.  Con ms frecuencia y a partir de una edad inferior si tiene sobrepeso o un alto riesgo de padecer diabetes. Qu debo saber sobre la prevencin de infecciones? Hepatitis B Si tiene un riesgo ms alto de contraer hepatitis B, debe someterse a un examen de deteccin de este virus. Hable con el mdico para averiguar si tiene riesgo de contraer la infeccin por hepatitis B. Hepatitis C Se recomienda un anlisis de sangre para:  Todos los que nacieron entre 1945 y 1965.  Todas las personas que tengan un riesgo de haber contrado hepatitis C. Enfermedades de transmisin sexual (ETS)  Debe realizarse pruebas de deteccin de ITS todos los aos, incluidas la gonorrea y la clamidia, si: ? Es sexualmente activo y es menor de 24aos. ? Es mayor de 24aos, y el mdico le informa que corre riesgo de tener este tipo de infecciones. ? La actividad sexual ha cambiado desde que le hicieron la ltima prueba de deteccin y tiene un riesgo mayor de tener clamidia o gonorrea. Pregntele al mdico si usted tiene riesgo.  Pregntele al mdico si usted tiene un alto riesgo de contraer VIH. El mdico tambin puede recomendarle un medicamento recetado para ayudar a evitar la infeccin por el VIH. Si elige tomar medicamentos para prevenir el VIH, primero debe hacerse los anlisis de deteccin del VIH. Luego debe hacerse anlisis cada 3meses mientras est tomando los  medicamentos. Siga estas instrucciones en su casa: Estilo de vida  No consuma ningn producto que contenga nicotina o tabaco, como cigarrillos, cigarrillos electrnicos y tabaco de mascar. Si necesita ayuda para dejar de fumar, consulte al mdico.  No consuma drogas.  No comparta agujas.  Solicite ayuda a su mdico si necesita apoyo o informacin para abandonar las drogas. Consumo de alcohol  No beba alcohol si el mdico se lo prohbe.  Si bebe alcohol: ? Limite la cantidad que consume de 0 a 2 medidas por da. ? Est atento a la cantidad de alcohol que hay en las bebidas que toma. En los [place], una medida equivale a una botella de cerveza de 12oz (355ml), un vaso de vino de 5oz (148ml) o un vaso de una bebida alcohlica de alta graduacin de 1oz (44ml). Instrucciones generales  Realcese los estudios de rutina de la salud, dentales y de la vista.  Mantngase al da con las vacunas.  Infrmele a su mdico si: ? Se siente deprimido con frecuencia. ? Alguna vez ha sido vctima de maltrato o no se siente seguro en su casa. Resumen  Adoptar un estilo de vida saludable y recibir atencin preventiva son importantes para promover la salud y el bienestar.  Siga las instrucciones del mdico   una dieta saludable, el ejercicio y la realizacin de pruebas o exmenes para Hotel manager.  Siga las instrucciones del mdico con respecto al control del colesterol y la presin arterial. Esta informacin no tiene Theme park manager el consejo del mdico. Asegrese de hacerle al mdico cualquier pregunta que tenga. Document Revised: 03/27/2018 Document Reviewed: 03/27/2018 Elsevier Patient Education  2020 Elsevier Inc.  Dislipidemia Dyslipidemia La dislipidemia es un desequilibrio de sustancias cerosas parecidas a la grasa (lpidos) en la [place]. El cuerpo necesita lpidos en pequeas cantidades. Con frecuencia, la dislipidemia implica un nivel alto de  colesterol o triglicridos, que son tipos de lpidos. Las formas frecuentes de dislipidemia incluyen las siguientes:  Niveles elevados de colesterol LDL. El LDL es el tipo de colesterol que causa la acumulacin de depsitos de grasa (placas) en los vasos sanguneos que transportan la sangre fuera del corazn (arterias).  Niveles bajos de colesterol HDL. El HDL es el tipo de colesterol que brinda proteccin contra las enfermedades cardacas. Los niveles altos de HDL eliminan la acumulacin de LDL de las arterias.  Niveles altos de triglicridos. Los triglicridos son Neomia Dear sustancia grasa presente en la sangre que se relaciona con la acumulacin de placa en las arterias. Cules son las causas? La dislipidemia primaria es causada por cambios (mutaciones) en los genes que se transmiten a travs de las familias (se heredan). Estas mutaciones causan varios tipos de dislipidemia. La dislipidemia secundaria es causada por elecciones de estilos de vida y enfermedades que ocasionan dislipidemia, tales como:  Seguir una dieta rica en grasa de origen animal.  No hacer suficiente ejercicio fsico.  Tener diabetes, enfermedad renal, enfermedad heptica o enfermedad tiroidea.  Beber grandes cantidades de alcohol.  Tomar ciertos medicamentos. Qu incrementa el riesgo? Tiene ms probabilidades de Aeronautical engineer afeccin si es un hombre mayor o si es una mujer que ha pasado por la menopausia. Otros factores de riesgo son los siguientes:  Tener antecedentes familiares de dislipidemia.  Tomar determinados medicamentos, entre ellos, pldoras anticonceptivas, corticoesteroides, algunos diurticos y betabloqueantes.  Fumar cigarrillos.  Consumir una dieta rica en grasas.  Tener ciertas afecciones mdicas, como diabetes, sndrome de ovario poliqustico (SOP), enfermedad renal, enfermedad heptica o hipotiroidismo.  No hacer ejercicio regularmente.  Tener sobrepeso o ser obeso con demasiada grasa en el  abdomen. Cules son los signos o los sntomas? En la [employer], la dislipidemia no causa ningn sntoma. En los [place] graves, los niveles muy altos de lpidos pueden causar:  Protuberancias de grasa debajo de la piel (xantomas).  Un anillo blanco o gris alrededor del centro negro (pupila) del ojo. Los niveles muy altos de triglicridos pueden causar inflamacin del pncreas (pancreatitis). Cmo se diagnostica? Su mdico puede diagnosticar dislipidemia basndose en un anlisis de sangre de rutina (anlisis de sangre en [place]). Como la [employer] de las personas no tienen sntomas de la afeccin, este anlisis de sangre (perfil de lpidos) se realiza en adultos mayores de 20 aos y se repite cada 5 aos. En este anlisis, se controla lo siguiente:  Colesterol total. Esto mide la cantidad total de colesterol en la sangre, que incluye el colesterol LDL, el colesterol HDL y los triglicridos. Un valor saludable es inferior a 200.  Colesterol LDL. El valor objetivo de colesterol LDL es diferente para cada persona, en funcin de los factores de riesgo individuales. Consulte al mdico cul debe ser el valor del colesterol LDL para usted.  Colesterol HDL. Un nivel de colesterol HDL de 60 o superior es lo  mejor porque ayuda a Health visitor las enfermedades cardacas. Un valor inferior a 40 en los hombres o inferior a 50 en las mujeres aumenta el riesgo de enfermedades cardacas.  Triglicridos. Un valor saludable de triglicridos es inferior a 150. Si su perfil de lpidos es anormal, su mdico puede realizar otros anlisis de [place]. Cmo se trata? El tratamiento depende del tipo de dislipidemia que usted tenga y sus otros factores de riesgo de enfermedades cardacas o accidente cerebrovascular. Su mdico tendr un rango objetivo para sus niveles de lpidos en funcin de esta informacin. Para muchas personas, esta afeccin puede tratarse con cambios en el estilo de vida, tales como dieta y  ejercicio. El mdico podra recomendarle que haga lo siguiente:  Hacer ejercicio con regularidad.  Realizar cambios en la dieta.  Si fuma, dejar de hacerlo. Si los cambios en la dieta y la actividad fsica no ayudan a Barista sus objetivos, el mdico tambin puede recetarle medicamentos para disminuir los lpidos. El tipo de medicamento recetado con ms frecuencia disminuye el colesterol LDL (estatinas). Si tiene Publishing copy de triglicridos, su mdico puede recetarle otro tipo de frmaco (fibratos) o un suplemento de aceite de pescado con omega-3, o ambos. Siga estas indicaciones en su casa:  Comida y bebida  Siga las indicaciones del mdico o el nutricionista respecto de las restricciones para las comidas o las bebidas.  Siga una dieta saludable como se lo haya indicado el mdico. Esto puede ayudarle a Barista y Pharmacologist un peso saludable, reducir el colesterol LDL y aumentar el colesterol HDL. Puede incluir: ? Limitar sus caloras, si tiene sobrepeso. ? Comer ms frutas, verduras, cereales integrales, pescado y carnes magras. ? Limitar las grasas saturadas, las grasas trans y Print production planner.  Si bebe alcohol: ? Limite la cantidad que [place]. ? Est atento a la cantidad de alcohol que hay en las bebidas que toma. En los [place], una medida equivale a una botella de cerveza de 12oz ( ), un vaso de vino de 5oz ( ) o un vaso de una bebida alcohlica de alta graduacin de 1oz (56ml).  No beba alcohol si: ? Su mdico le indica no hacerlo. ? Est embarazada, puede estar embarazada o est tratando de quedar embarazada. Actividad  Haga ejercicio con regularidad. Siga un programa de ejercicio y entrenamiento de fuerza tal como se lo haya indicado el mdico. Pregntele al mdico qu actividades son seguras para usted. El mdico puede recomendarle lo siguiente: ? 30 minutos de [place] de 4 a 6 das por [address] La caminata a paso ligero es un ejemplo de  [place]. ? Entrenamiento de fuerza 2 [employer]. Indicaciones generales  No consuma ningn producto que contenga nicotina o tabaco, como cigarrillos, cigarrillos electrnicos y tabaco de Theatre manager. Si necesita ayuda para dejar de fumar, consulte al mdico.  [employer] de venta libre y los recetados solamente como se lo haya indicado el mdico. Esto incluye los suplementos.  Concurra a todas las visitas de seguimiento como se lo haya indicado el mdico. Comunquese con un mdico si:  Usted: ? Tiene dificultad para cumplir con su plan de actividad fsica o dieta. ? Le cuesta dejar de fumar o controlar el consumo de alcohol. Resumen  Con frecuencia, la dislipidemia implica un nivel alto de colesterol o triglicridos, que son tipos de lpidos.  El tratamiento depende del tipo de dislipidemia que usted tenga y sus otros factores de riesgo de enfermedades cardacas o accidente cerebrovascular.  Para muchas personas, el  tratamiento comienza con cambios en el estilo de vida, tales como dieta y ejercicio.  Su mdico puede recetarle medicamentos para disminuir los lpidos. Esta informacin no tiene Theme park manager el consejo del mdico. Asegrese de hacerle al mdico cualquier pregunta que tenga. Document Revised: 11/07/2017 Document Reviewed: 11/07/2017 Elsevier Patient Education  2020 [employer].

## 2019-12-30 NOTE — Telephone Encounter (Signed)
na

## 2020-02-05 ENCOUNTER — Ambulatory Visit (INDEPENDENT_AMBULATORY_CARE_PROVIDER_SITE_OTHER): Payer: BC Managed Care – PPO | Admitting: Emergency Medicine

## 2020-02-05 ENCOUNTER — Other Ambulatory Visit: Payer: Self-pay

## 2020-02-05 ENCOUNTER — Encounter: Payer: Self-pay | Admitting: Emergency Medicine

## 2020-02-05 VITALS — BP 126/85 | HR 80 | Temp 98.1°F | Ht 72.0 in | Wt 216.0 lb

## 2020-02-05 DIAGNOSIS — J449 Chronic obstructive pulmonary disease, unspecified: Secondary | ICD-10-CM

## 2020-02-05 DIAGNOSIS — Z23 Encounter for immunization: Secondary | ICD-10-CM

## 2020-02-05 DIAGNOSIS — R1010 Upper abdominal pain, unspecified: Secondary | ICD-10-CM

## 2020-02-05 DIAGNOSIS — Z8719 Personal history of other diseases of the digestive system: Secondary | ICD-10-CM

## 2020-02-05 LAB — POCT URINALYSIS DIP (MANUAL ENTRY)
Bilirubin, UA: NEGATIVE
Blood, UA: NEGATIVE
Glucose, UA: NEGATIVE mg/dL
Ketones, POC UA: NEGATIVE mg/dL
Leukocytes, UA: NEGATIVE
Nitrite, UA: NEGATIVE
Protein Ur, POC: NEGATIVE mg/dL
Spec Grav, UA: >=1.03 — AB (ref 1.010–1.025)
Urobilinogen, UA: 0.2 E.U./dL
pH, UA: 5 (ref 5.0–8.0)

## 2020-02-05 NOTE — Progress Notes (Signed)
Scott Hanna 54 y.o.   Chief Complaint  Patient presents with  . Abdominal Pain    taking omeprazole for the pain for some yrs now, told some yrs ago that he needs to f/u with gastro  . Immunizations    wants to talk about the flu vaccine vs covid, did receive covid vaccine. Not sure why he needs both    HISTORY OF PRESENT ILLNESS: This is a 54 y.o. male had episode of gastritis 2 weeks ago which lasted 2 to 3 days and is now better.  Asymptomatic at present time. Had similar episode years ago.  Took milk of magnesia and omeprazole with relief. Fully vaccinated against Covid.  Needs influenza vaccine. No other complaints or medical concerns today. Has history of COPD.  Chest x-ray done earlier this year reviewed with patient.  Stopped smoking and exercise tolerance has improved.  Asymptomatic.  HPI   Prior to Admission medications   Medication Sig Start Date End Date Taking? Authorizing Provider  aspirin EC 81 MG tablet Take 81 mg by mouth daily. Once he remembers   Yes [provider]  rosuvastatin (CRESTOR) 20 MG tablet Take 1 tablet (20 mg total) by mouth daily. 09/19/19  Yes Georgina Quint, MD  buPROPion [facility] SR) 150 MG 12 hr tablet Take 1 tablet (150 mg total) by mouth daily. 09/19/19 12/18/19  Georgina Quint, MD    No Known Allergies  There are no problems to display for this patient.   No past medical history on file.  No past surgical history on file.  Social History   Socioeconomic History  . Marital status: Married    Spouse name: Not on file  . Number of children: Not on file  . Years of education: Not on file  . Highest education level: Not on file  Occupational History  . Not on file  Tobacco Use  . Smoking status: Former Games developer  . Smokeless tobacco: Never Used  Substance and Sexual Activity  . Alcohol use: Not Currently  . Drug use: Never  . Sexual activity: Yes  Other Topics Concern  . Not on file  Social  History Narrative  . Not on file   Social Determinants of Health   Financial Resource Strain:   . Difficulty of Paying Living Expenses: Not on file  Food Insecurity:   . Worried About Programme researcher, broadcasting/film/video in the Last Year: Not on file  . Ran Out of Food in the Last Year: Not on file  Transportation Needs:   . Lack of Transportation (Medical): Not on file  . Lack of Transportation (Non-Medical): Not on file  Physical Activity:   . Days of Exercise per Week: Not on file  . Minutes of Exercise per Session: Not on file  Stress:   . Feeling of Stress : Not on file  Social Connections:   . Frequency of Communication with Friends and Family: Not on file  . Frequency of Social Gatherings with Friends and Family: Not on file  . Attends Religious Services: Not on file  . Active Member of Clubs or Organizations: Not on file  . Attends Banker Meetings: Not on file  . Marital Status: Not on file  Intimate Partner Violence:   . Fear of Current or Ex-Partner: Not on file  . Emotionally Abused: Not on file  . Physically Abused: Not on file  . Sexually Abused: Not on file    No family history on file.  Review of Systems  Constitutional: Negative.  Negative for chills and fever.  HENT: Negative.  Negative for congestion and sore throat.   Respiratory: Negative.  Negative for cough and shortness of breath.   Cardiovascular: Negative.  Negative for chest pain and palpitations.  Gastrointestinal: Negative.  Negative for abdominal pain, diarrhea, nausea and vomiting.  Genitourinary: Negative.  Negative for dysuria and hematuria.  Musculoskeletal: Negative.  Negative for back pain and myalgias.  Skin: Negative.  Negative for rash.  Neurological: Negative.  Negative for dizziness and headaches.  All other systems reviewed and are negative.  Today's Vitals   02/05/20 1102  BP: 126/85  Pulse: 80  Temp: 98.1 F (36.7 C)  SpO2: 96%  Weight: 216 lb (98 kg)  Height: 6' (1.829  m)   Body mass index is 29.29 kg/m.   Physical Exam Vitals reviewed.  Constitutional:      Appearance: He is well-developed.  HENT:     Head: Normocephalic.  Eyes:     Extraocular Movements: Extraocular movements intact.     Conjunctiva/sclera: Conjunctivae normal.     Pupils: Pupils are equal, round, and reactive to light.  Cardiovascular:     Rate and Rhythm: Normal rate and regular rhythm.     Pulses: Normal pulses.     Heart sounds: Normal heart sounds.  Pulmonary:     Effort: Pulmonary effort is normal.     Breath sounds: Normal breath sounds.  Abdominal:     General: Bowel sounds are normal. There is no distension.     Palpations: Abdomen is soft. There is no mass.     Tenderness: There is no abdominal tenderness. There is no right CVA tenderness, left CVA tenderness, guarding or rebound.  Musculoskeletal:        General: Normal range of motion.     Cervical back: Normal range of motion and neck supple.  Skin:    General: Skin is warm and dry.     Capillary Refill: Capillary refill takes less than 2 seconds.  Neurological:     General: No focal deficit present.     Mental Status: He is alert and oriented to person, place, and time.  Psychiatric:        Mood and Affect: Mood normal.        Behavior: Behavior normal.    A total of 30 minutes was spent with the patient, greater than 50% of which was in counseling/coordination of care regarding differential diagnosis of upper abdominal pain, management, review of all medications, ED precautions, review of most recent office visit notes, review of most recent blood work results, education on nutrition, review of most recent chest x-ray and findings of COPD, health maintenance items, documentation, prognosis and need for follow-up.   ASSESSMENT & PLAN: Clinically stable.  No medical concerns identified during this visit. Follow-up in 6 months. [name] was seen today for abdominal pain and immunizations.  Diagnoses and  all orders for this visit:  Pain of upper abdomen Comments: Resolved Orders: -     POCT urinalysis dipstick  Chronic obstructive pulmonary disease, unspecified COPD type (HCC)  History of gastritis  Need for influenza vaccination -     Flu Vaccine QUAD 36+ mos IM    Patient Instructions       If you have lab work done today you will be contacted with your lab results within the next 2 weeks.  If you have not heard from [place] then please contact us. The fastest way  to get your results is to register for My Chart.   IF you received an x-ray today, you will receive an invoice from [facility]. Please contact [facility] at [phone] with questions or concerns regarding your invoice.   IF you received labwork today, you will receive an invoice from [facility]. Please contact [facility] at [phone] with questions or concerns regarding your invoice.   Our billing staff will not be able to assist you with questions regarding bills from these companies.  You will be contacted with the lab results as soon as they are available. The fastest way to get your results is to activate your My Chart account. Instructions are located on the last page of this paperwork. If you have not heard from [place] regarding the results in 2 weeks, please contact this office.     Mantenimiento de Research officer, political party, Male Adoptar un estilo de vida saludable y recibir atencin preventiva son importantes para promover la salud y Counsellor. Consulte al mdico sobre:  El esquema adecuado para hacerse pruebas y exmenes peridicos.  Cosas que puede hacer por su cuenta para prevenir enfermedades y [place] sano. Qu debo saber sobre la dieta, el peso y el ejercicio? Consuma una dieta saludable   Consuma una dieta que incluya muchas verduras, frutas, productos lcteos con bajo contenido de [place] y Associate Professor.  No consuma muchos alimentos ricos en grasas  slidas, azcares agregados o sodio. Mantenga un peso saludable El ndice de masa muscular [facility]) es una medida que puede utilizarse para identificar posibles problemas de [place]. Proporciona una estimacin de la grasa corporal basndose en el peso y la altura. Su mdico puede ayudarle a Engineer, site IMC y a Personnel officer o Pharmacologist un peso saludable. Haga ejercicio con regularidad Haga ejercicio con regularidad. Esta es una de las prcticas ms importantes que puede hacer por su salud. La mayora de los adultos deben seguir estas pautas:  Education officer, environmental, al menos, de actividad fsica por semana. El ejercicio debe aumentar la frecuencia cardaca y Media planner transpirar (ejercicio de intensidad moderada).  Hacer ejercicios de fortalecimiento por lo [employer] por semana. Agregue esto a su plan de ejercicio de intensidad moderada.  Pasar menos tiempo sentados. Incluso la actividad fsica ligera puede ser beneficiosa. Controle sus niveles de colesterol y lpidos en la sangre Comience a realizarse anlisis de lpidos y Oncologist en la sangre a los 20aos y luego reptalos cada 5aos. Es posible que Insurance underwriter los niveles de colesterol con mayor frecuencia si:  Sus niveles de lpidos y colesterol son altos.  Es mayor de 40aos.  Presenta un alto riesgo de padecer enfermedades cardacas. Qu debo saber sobre las pruebas de deteccin del cncer? Muchos tipos de cncer pueden detectarse de manera temprana y, a menudo, pueden prevenirse. Segn su historia clnica y sus antecedentes familiares, es posible que deba realizarse pruebas de deteccin del cncer en diferentes edades. Esto puede incluir pruebas de deteccin de lo siguiente:  Building services engineer.  Cncer de prstata.  Cncer de piel.  Cncer de pulmn. Qu debo saber sobre la enfermedad cardaca, la diabetes y la hipertensin arterial? Presin arterial y enfermedad cardaca  La hipertensin arterial causa enfermedades  cardacas y [place] el riesgo de accidente cerebrovascular. Es ms probable que esto se manifieste en las personas que tienen lecturas de presin arterial alta, tienen ascendencia africana o tienen sobrepeso.  Hable con el mdico sobre sus valores de presin arterial deseados.  Hgase controlar la presin arterial: ?  Cada 3 a 5 aos si tiene entre 18 y 4039 aos. ? Todos los aos si es mayor de [place]40aos.  Si tiene entre 65 y 2375 aos y es fumador o Insurance underwritersola fumar, pregntele al mdico si debe realizarse una prueba de deteccin de aneurisma artico abdominal (AAA) por nica vez. Diabetes Realcese exmenes de deteccin de la diabetes con regularidad. Este anlisis revisa el nivel de azcar en la sangre en [place]. Hgase las pruebas de deteccin:  Cada tresaos despus de los 45aos de edad si tiene un peso normal y un bajo riesgo de padecer diabetes.  Con ms frecuencia y a partir de [place] ed[place] inferior si tiene sobrepeso o un alto riesgo de padecer diabetes. Qu debo saber sobre la prevencin de infecciones? Hepatitis B Si tiene un riesgo ms alto de contraer hepatitis B, debe someterse a un examen de deteccin de este virus. Hable con el mdico para averiguar si tiene riesgo de contraer la infeccin por hepatitis B. Hepatitis C Se recomienda un anlisis de [place] para:  Todos los que nacieron entre 1945 y [phone].  Todas las personas que tengan un riesgo de haber contrado hepatitis C. Enfermedades de transmisin sexual (ETS)  Debe realizarse pruebas de deteccin de ITS todos los aos, incluidas la gonorrea y la clamidia, si: ? Es sexualmente activo y es menor de 24aos. ? Es mayor de 24aos, y Public affairs consultantel mdico le informa que corre riesgo de tener este tipo de infecciones. ? La actividad sexual ha cambiado desde que le hicieron la ltima prueba de deteccin y tiene un riesgo mayor de Warehouse managertener clamidia o Copygonorrea. Pregntele al mdico si usted tiene riesgo.  Pregntele al mdico si usted tiene un alto  riesgo de Primary school teachercontraer VIH. El mdico tambin puede recomendarle un medicamento recetado para ayudar a evitar la infeccin por el VIH. Si elige tomar medicamentos para prevenir el VIH, primero debe ONEOKhacerse los anlisis de deteccin del VIH. Luego debe hacerse anlisis cada 3meses mientras est tomando los medicamentos. Siga estas instrucciones en su casa: Estilo de vida  No consuma ningn producto que contenga nicotina o tabaco, como cigarrillos, cigarrillos electrnicos y tabaco de Theatre managermascar. Si necesita ayuda para dejar de fumar, consulte al mdico.  No consuma drogas.  No comparta agujas.  Solicite ayuda a su mdico si necesita apoyo o informacin para abandonar las drogas. Consumo de alcohol  No beba alcohol si el mdico se lo prohbe.  Si bebe alcohol: ? Limite la cantidad que consume de 0 a 2 medidas por da. ? Est atento a la cantidad de alcohol que hay en las bebidas que toma. En [place], una medida equivale a una botella de cerveza de 12oz (355ml), un vaso de vino de 5oz (148ml) o un vaso de una bebida alcohlica de alta graduacin de 1oz (44ml). Instrucciones generales  Realcese los estudios de rutina de la salud, dentales y de Wellsite geologistla vista.  Mantngase al da con las vacunas.  Infrmele a su mdico si: ? Se siente deprimido con frecuencia. ? Alguna vez ha sido vctima de [place] o no se siente seguro en su casa. Resumen  Adoptar un estilo de vida saludable y recibir atencin preventiva son importantes para promover la salud y Counsellorel bienestar.  Siga las instrucciones del mdico acerca de una dieta saludable, el ejercicio y la realizacin de pruebas o exmenes para Hotel managerdetectar enfermedades.  Siga las instrucciones del mdico con respecto al control del colesterol y la presin arterial. Esta informacin no tiene Building services engineercomo fin reemplazar el  consejo del mdico. Asegrese de hacerle al mdico cualquier pregunta que tenga. Document Revised: 03/27/2018 Document Reviewed:  03/27/2018 Elsevier Patient Education  2020 Elsevier Inc.      Edwina Barth, MD [facility]

## 2020-02-05 NOTE — Patient Instructions (Addendum)
   If you have lab work done today you will be contacted with your lab results within the next 2 weeks.  If you have not heard from us then please contact us. The fastest way to get your results is to register for My Chart.   IF you received an x-ray today, you will receive an invoice from [facility]. Please contact [facility] at [phone] with questions or concerns regarding your invoice.   IF you received labwork today, you will receive an invoice from [facility]. Please contact [facility] at [phone] with questions or concerns regarding your invoice.   Our billing staff will not be able to assist you with questions regarding bills from these companies.  You will be contacted with the lab results as soon as they are available. The fastest way to get your results is to activate your My Chart account. Instructions are located on the last page of this paperwork. If you have not heard from us regarding the results in 2 weeks, please contact this office.       Mantenimiento de la salud en los hombres Health Maintenance, Male Adoptar un estilo de vida saludable y recibir atencin preventiva son importantes para promover la salud y el bienestar. Consulte al mdico sobre:  El esquema adecuado para hacerse pruebas y exmenes peridicos.  Cosas que puede hacer por su cuenta para prevenir enfermedades y mantenerse sano. Qu debo saber sobre la dieta, el peso y el ejercicio? Consuma una dieta saludable   Consuma una dieta que incluya muchas verduras, frutas, productos lcteos con bajo contenido de grasa y protenas magras.  No consuma muchos alimentos ricos en grasas slidas, azcares agregados o sodio. Mantenga un peso saludable El ndice de masa muscular (IMC) es una medida que puede utilizarse para identificar posibles problemas de peso. Proporciona una estimacin de la grasa corporal basndose en el peso y la altura. Su mdico puede ayudarle a determinar su IMC y  a lograr o mantener un peso saludable. Haga ejercicio con regularidad Haga ejercicio con regularidad. Esta es una de las prcticas ms importantes que puede hacer por su salud. La mayora de los adultos deben seguir estas pautas:  Realizar, al menos, 150minutos de actividad fsica por semana. El ejercicio debe aumentar la frecuencia cardaca y hacerlo transpirar (ejercicio de intensidad moderada).  Hacer ejercicios de fortalecimiento por lo menos dos veces por semana. Agregue esto a su plan de ejercicio de intensidad moderada.  Pasar menos tiempo sentados. Incluso la actividad fsica ligera puede ser beneficiosa. Controle sus niveles de colesterol y lpidos en la sangre Comience a realizarse anlisis de lpidos y colesterol en la sangre a los 20aos y luego reptalos cada 5aos. Es posible que necesite controlar los niveles de colesterol con mayor frecuencia si:  Sus niveles de lpidos y colesterol son altos.  Es mayor de 40aos.  Presenta un alto riesgo de padecer enfermedades cardacas. Qu debo saber sobre las pruebas de deteccin del cncer? Muchos tipos de cncer pueden detectarse de manera temprana y, a menudo, pueden prevenirse. Segn su historia clnica y sus antecedentes familiares, es posible que deba realizarse pruebas de deteccin del cncer en diferentes edades. Esto puede incluir pruebas de deteccin de lo siguiente:  Cncer colorrectal.  Cncer de prstata.  Cncer de piel.  Cncer de pulmn. Qu debo saber sobre la enfermedad cardaca, la diabetes y la hipertensin arterial? Presin arterial y enfermedad cardaca  La hipertensin arterial causa enfermedades cardacas y aumenta el riesgo de accidente cerebrovascular. Es ms   probable que esto se manifieste en las personas que tienen lecturas de presin arterial alta, tienen ascendencia africana o tienen sobrepeso.  Hable con el mdico sobre sus valores de presin arterial deseados.  Hgase controlar la presin  arterial: ? Cada 3 a 5 aos si tiene entre 18 y 39 aos. ? Todos los aos si es mayor de 40aos.  Si tiene entre 65 y 75 aos y es fumador o sola fumar, pregntele al mdico si debe realizarse una prueba de deteccin de aneurisma artico abdominal (AAA) por nica vez. Diabetes Realcese exmenes de deteccin de la diabetes con regularidad. Este anlisis revisa el nivel de azcar en la sangre en ayunas. Hgase las pruebas de deteccin:  Cada tresaos despus de los 45aos de edad si tiene un peso normal y un bajo riesgo de padecer diabetes.  Con ms frecuencia y a partir de una edad inferior si tiene sobrepeso o un alto riesgo de padecer diabetes. Qu debo saber sobre la prevencin de infecciones? Hepatitis B Si tiene un riesgo ms alto de contraer hepatitis B, debe someterse a un examen de deteccin de este virus. Hable con el mdico para averiguar si tiene riesgo de contraer la infeccin por hepatitis B. Hepatitis C Se recomienda un anlisis de sangre para:  Todos los que nacieron entre 1945 y 1965.  Todas las personas que tengan un riesgo de haber contrado hepatitis C. Enfermedades de transmisin sexual (ETS)  Debe realizarse pruebas de deteccin de ITS todos los aos, incluidas la gonorrea y la clamidia, si: ? Es sexualmente activo y es menor de 24aos. ? Es mayor de 24aos, y el mdico le informa que corre riesgo de tener este tipo de infecciones. ? La actividad sexual ha cambiado desde que le hicieron la ltima prueba de deteccin y tiene un riesgo mayor de tener clamidia o gonorrea. Pregntele al mdico si usted tiene riesgo.  Pregntele al mdico si usted tiene un alto riesgo de contraer VIH. El mdico tambin puede recomendarle un medicamento recetado para ayudar a evitar la infeccin por el VIH. Si elige tomar medicamentos para prevenir el VIH, primero debe hacerse los anlisis de deteccin del VIH. Luego debe hacerse anlisis cada 3meses mientras est tomando los  medicamentos. Siga estas instrucciones en su casa: Estilo de vida  No consuma ningn producto que contenga nicotina o tabaco, como cigarrillos, cigarrillos electrnicos y tabaco de mascar. Si necesita ayuda para dejar de fumar, consulte al mdico.  No consuma drogas.  No comparta agujas.  Solicite ayuda a su mdico si necesita apoyo o informacin para abandonar las drogas. Consumo de alcohol  No beba alcohol si el mdico se lo prohbe.  Si bebe alcohol: ? Limite la cantidad que consume de 0 a 2 medidas por da. ? Est atento a la cantidad de alcohol que hay en las bebidas que toma. En los [place], una medida equivale a una botella de cerveza de 12oz (355ml), un vaso de vino de 5oz (148ml) o un vaso de una bebida alcohlica de alta graduacin de 1oz (44ml). Instrucciones generales  Realcese los estudios de rutina de la salud, dentales y de la vista.  Mantngase al da con las vacunas.  Infrmele a su mdico si: ? Se siente deprimido con frecuencia. ? Alguna vez ha sido vctima de maltrato o no se siente seguro en su casa. Resumen  Adoptar un estilo de vida saludable y recibir atencin preventiva son importantes para promover la salud y el bienestar.  Siga las instrucciones del mdico   acerca de una dieta saludable, el ejercicio y la realizacin de pruebas o exmenes para detectar enfermedades.  Siga las instrucciones del mdico con respecto al control del colesterol y la presin arterial. Esta informacin no tiene como fin reemplazar el consejo del mdico. Asegrese de hacerle al mdico cualquier pregunta que tenga. Document Revised: 03/27/2018 Document Reviewed: 03/27/2018 Elsevier Patient Education  2020 Elsevier Inc.  

## 2020-05-03 DIAGNOSIS — Z23 Encounter for immunization: Secondary | ICD-10-CM | POA: Diagnosis not present

## 2020-05-03 DIAGNOSIS — S61203A Unspecified open wound of left middle finger without damage to nail, initial encounter: Secondary | ICD-10-CM | POA: Diagnosis not present

## 2020-05-17 DIAGNOSIS — Z4802 Encounter for removal of sutures: Secondary | ICD-10-CM | POA: Diagnosis not present

## 2020-05-26 ENCOUNTER — Ambulatory Visit: Payer: BC Managed Care – PPO | Admitting: Emergency Medicine

## 2020-05-26 ENCOUNTER — Encounter: Payer: Self-pay | Admitting: Emergency Medicine

## 2020-05-26 ENCOUNTER — Other Ambulatory Visit: Payer: Self-pay

## 2020-05-26 VITALS — BP 135/88 | HR 81 | Temp 98.2°F | Resp 16 | Ht 72.0 in | Wt 237.0 lb

## 2020-05-26 DIAGNOSIS — J449 Chronic obstructive pulmonary disease, unspecified: Secondary | ICD-10-CM

## 2020-05-26 DIAGNOSIS — M549 Dorsalgia, unspecified: Secondary | ICD-10-CM

## 2020-05-26 DIAGNOSIS — E785 Hyperlipidemia, unspecified: Secondary | ICD-10-CM | POA: Diagnosis not present

## 2020-05-26 DIAGNOSIS — Z87891 Personal history of nicotine dependence: Secondary | ICD-10-CM

## 2020-05-26 DIAGNOSIS — Z8719 Personal history of other diseases of the digestive system: Secondary | ICD-10-CM

## 2020-05-26 NOTE — Progress Notes (Signed)
Scott Hanna 55 y.o.   Chief Complaint  Patient presents with  . Medical Management of Chronic Issues    Follow up    HISTORY OF PRESENT ILLNESS: This is a 55 y.o. male here for follow-up of chronic medical problems. Has history of COPD, ex smoker.  Doing well.  No complaints. History of dyslipidemia on rosuvastatin 20 mg daily. Complaining of occasional left-sided mid back pain related to movement and activity. Also concerned about sperm count. No other complaints or medical concerns today.  HPI   Prior to Admission medications   Medication Sig Start Date End Date Taking? Authorizing Provider  aspirin EC 81 MG tablet Take 81 mg by mouth daily. Once he remembers   Yes [provider]  rosuvastatin (CRESTOR) 20 MG tablet Take 1 tablet (20 mg total) by mouth daily. 09/19/19  Yes Georgina Quint, MD  buPROPion [facility] SR) 150 MG 12 hr tablet Take 1 tablet (150 mg total) by mouth daily. 09/19/19 12/18/19  Georgina Quint, MD    No Known Allergies  Patient Active Problem List   Diagnosis Date Noted  . Chronic obstructive pulmonary disease (HCC) 02/05/2020    History reviewed. No pertinent past medical history.  History reviewed. No pertinent surgical history.  Social History   Socioeconomic History  . Marital status: Married    Spouse name: Not on file  . Number of children: Not on file  . Years of education: Not on file  . Highest education level: Not on file  Occupational History  . Not on file  Tobacco Use  . Smoking status: Former Games developer  . Smokeless tobacco: Never Used  Substance and Sexual Activity  . Alcohol use: Not Currently  . Drug use: Never  . Sexual activity: Yes  Other Topics Concern  . Not on file  Social History Narrative  . Not on file   Social Determinants of Health   Financial Resource Strain: Not on file  Food Insecurity: Not on file  Transportation Needs: Not on file  Physical Activity: Not on file   Stress: Not on file  Social Connections: Not on file  Intimate Partner Violence: Not on file    History reviewed. No pertinent family history.   Review of Systems  Constitutional: Negative.  Negative for chills and fever.  HENT: Negative.  Negative for congestion and sore throat.   Respiratory: Negative.  Negative for cough and shortness of breath.   Cardiovascular: Negative.  Negative for chest pain and palpitations.  Gastrointestinal: Negative.  Negative for abdominal pain, blood in stool, diarrhea, melena, nausea and vomiting.  Genitourinary: Negative.  Negative for dysuria and hematuria.  Musculoskeletal: Positive for back pain.  Skin: Negative.  Negative for rash.  Neurological: Negative.  Negative for dizziness and headaches.  All other systems reviewed and are negative.    Today's Vitals   05/26/20 0937  BP: 135/88  Pulse: 81  Resp: 16  Temp: 98.2 F (36.8 C)  TempSrc: Temporal  SpO2: 96%  Weight: 237 lb (107.5 kg)  Height: 6' (1.829 m)   Body mass index is 32.14 kg/m. Wt Readings from Last 3 Encounters:  05/26/20 237 lb (107.5 kg)  02/05/20 216 lb (98 kg)  11/27/19 214 lb (97.1 kg)     Physical Exam Vitals reviewed.  Constitutional:      Appearance: Normal appearance.  HENT:     Head: Normocephalic.  Eyes:     Extraocular Movements: Extraocular movements intact.     Conjunctiva/sclera:  Conjunctivae normal.     Pupils: Pupils are equal, round, and reactive to light.  Cardiovascular:     Rate and Rhythm: Normal rate.  Pulmonary:     Effort: Pulmonary effort is normal.  Abdominal:     Palpations: Abdomen is soft.     Tenderness: There is no abdominal tenderness.  Musculoskeletal:     Cervical back: Normal range of motion and neck supple.  Skin:    General: Skin is warm and dry.     Capillary Refill: Capillary refill takes less than 2 seconds.  Neurological:     General: No focal deficit present.     Mental Status: He is alert and oriented to  person, place, and time.  Psychiatric:        Mood and Affect: Mood normal.        Behavior: Behavior normal.    A total of 30 minutes was spent with the patient, greater than 50% of which was in counseling/coordination of care regarding multiple chronic medical problems and concerns, cardiovascular risks associated with these conditions, education on nutrition, advise regarding evaluation of sperm count and need for urology evaluation, review of all medications, review of most recent office visit notes, review of most recent blood work results, prognosis, documentation, need for follow-up.   ASSESSMENT & PLAN: Clinically stable.  No medical concerns identified during this visit. Continue present medications.  No changes. Follow-up in 6 months. Scott Hanna was seen today for medical management of chronic issues.  Diagnoses and all orders for this visit:  Chronic obstructive pulmonary disease, unspecified COPD type (HCC) -     Comprehensive metabolic panel -     DG Chest 2 View; Future  History of gastritis  Dyslipidemia -     Comprehensive metabolic panel -     Hemoglobin A1c -     Lipid panel  Former smoker  Musculoskeletal back pain    Patient Instructions       If you have lab work done today you will be contacted with your lab results within the next 2 weeks.  If you have not heard from [place] then please contact us. The fastest way to get your results is to register for My Chart.   IF you received an x-ray today, you will receive an invoice from [facility]. Please contact [facility] at [phone] with questions or concerns regarding your invoice.   IF you received labwork today, you will receive an invoice from [place]. Please contact [facility] at [phone] with questions or concerns regarding your invoice.   Our billing staff will not be able to assist you with questions regarding bills from these companies.  You will be contacted with the lab  results as soon as they are available. The fastest way to get your results is to activate your My Chart account. Instructions are located on the last page of this paperwork. If you have not heard from [place] regarding the results in 2 weeks, please contact this office.     Mantenimiento de Research officer, political party, Male Adoptar un estilo de vida saludable y recibir atencin preventiva son importantes para promover la salud y Counsellor. Consulte al mdico sobre:  El esquema adecuado para hacerse pruebas y exmenes peridicos.  Cosas que puede hacer por su cuenta para prevenir enfermedades y [place] sano. Qu debo saber sobre la dieta, el peso y el ejercicio? Consuma una dieta saludable  Consuma una dieta que incluya muchas verduras, frutas, productos lcteos  con bajo contenido de grasa y protenas magras.  No consuma muchos alimentos ricos en grasas slidas, azcares agregados o sodio.   Mantenga un peso saludable El ndice de masa muscular [facility]) es una medida que puede utilizarse para identificar posibles problemas de [place]. Proporciona una estimacin de la grasa corporal basndose en el peso y la altura. Su mdico puede ayudarle a Engineer, site IMC y a Personnel officer o Pharmacologist un peso saludable. Haga ejercicio con regularidad Haga ejercicio con regularidad. Esta es una de las prcticas ms importantes que puede hacer por su salud. La mayora de los adultos deben seguir estas pautas:  Education officer, environmental, al menos, de actividad fsica por semana. El ejercicio debe aumentar la frecuencia cardaca y Media planner transpirar (ejercicio de intensidad moderada).  Hacer ejercicios de fortalecimiento por lo [employer] por semana. Agregue esto a su plan de ejercicio de intensidad moderada.  Pasar menos tiempo sentados. Incluso la actividad fsica ligera puede ser beneficiosa. Controle sus niveles de colesterol y lpidos en la sangre Comience a realizarse anlisis de lpidos y  Oncologist en la sangre a los 20aos y luego reptalos cada 5aos. Es posible que Insurance underwriter los niveles de colesterol con mayor frecuencia si:  Sus niveles de lpidos y colesterol son altos.  Es mayor de 40aos.  Presenta un alto riesgo de padecer enfermedades cardacas. Qu debo saber sobre las pruebas de deteccin del cncer? Muchos tipos de cncer pueden detectarse de manera temprana y, a menudo, pueden prevenirse. Segn su historia clnica y sus antecedentes familiares, es posible que deba realizarse pruebas de deteccin del cncer en diferentes edades. Esto puede incluir pruebas de deteccin de lo siguiente:  Building services engineer.  Cncer de prstata.  Cncer de piel.  Cncer de pulmn. Qu debo saber sobre la enfermedad cardaca, la diabetes y la hipertensin arterial? Presin arterial y enfermedad cardaca  La hipertensin arterial causa enfermedades cardacas y [place] el riesgo de accidente cerebrovascular. Es ms probable que esto se manifieste en las personas que tienen lecturas de presin arterial alta, tienen ascendencia africana o tienen sobrepeso.  Hable con el mdico sobre sus valores de presin arterial deseados.  Hgase controlar la presin arterial: ? Cada 3 a 5 aos si tiene entre 18 y 77 aos. ? Todos los aos si es mayor de [place].  Si tiene entre 65 y 39 aos y es fumador o Insurance underwriter, pregntele al mdico si debe realizarse una prueba de deteccin de aneurisma artico abdominal (AAA) por nica vez. Diabetes Realcese exmenes de deteccin de la diabetes con regularidad. Este anlisis revisa el nivel de azcar en la sangre en [place]. Hgase las pruebas de deteccin:  Cada tresaos despus de los 45aos de edad si tiene un peso normal y un bajo riesgo de padecer diabetes.  Con ms frecuencia y a partir de [place] edad inferior si tiene sobrepeso o un alto riesgo de padecer diabetes. Qu debo saber sobre la prevencin de infecciones? Hepatitis  B Si tiene un riesgo ms alto de contraer hepatitis B, debe someterse a un examen de deteccin de este virus. Hable con el mdico para averiguar si tiene riesgo de contraer la infeccin por hepatitis B. Hepatitis C Se recomienda un anlisis de [place] para:  Todos los que nacieron entre 1945 y [phone].  Todas las personas que tengan un riesgo de haber contrado hepatitis C. Enfermedades de transmisin sexual (ETS)  Debe realizarse pruebas de deteccin de ITS todos los aos, incluidas la gonorrea y la clamidia, si: ? Es sexualmente  activo y es menor de [place]24aos. ? Es mayor de 24aos, y Public affairs consultantel mdico le informa que corre riesgo de tener este tipo de infecciones. ? La actividad sexual ha cambiado desde que le hicieron la ltima prueba de deteccin y tiene un riesgo mayor de Warehouse managertener clamidia o Copygonorrea. Pregntele al mdico si usted tiene riesgo.  Pregntele al mdico si usted tiene un alto riesgo de Primary school teachercontraer VIH. El mdico tambin puede recomendarle un medicamento recetado para ayudar a evitar la infeccin por el VIH. Si elige tomar medicamentos para prevenir el VIH, primero debe ONEOKhacerse los anlisis de deteccin del VIH. Luego debe hacerse anlisis cada 3meses mientras est tomando los medicamentos. Siga estas instrucciones en su casa: Estilo de vida  No consuma ningn producto que contenga nicotina o tabaco, como cigarrillos, cigarrillos electrnicos y tabaco de Theatre managermascar. Si necesita ayuda para dejar de fumar, consulte al mdico.  No consuma drogas.  No comparta agujas.  Solicite ayuda a su mdico si necesita apoyo o informacin para abandonar las drogas. Consumo de alcohol  No beba alcohol si el mdico se lo prohbe.  Si bebe alcohol: ? Limite la cantidad que consume de 0 a 2 medidas por da. ? Est atento a la cantidad de alcohol que hay en las bebidas que toma. En los [place], una medida equivale a una botella de cerveza de 12oz (355ml), un vaso de vino de 5oz (148ml) o un vaso de  una bebida alcohlica de alta graduacin de 1oz (44ml). Instrucciones generales  Realcese los estudios de rutina de la salud, dentales y de Wellsite geologistla vista.  Mantngase al da con las vacunas.  Infrmele a su mdico si: ? Se siente deprimido con frecuencia. ? Alguna vez ha sido vctima de [place] o no se siente seguro en su casa. Resumen  Adoptar un estilo de vida saludable y recibir atencin preventiva son importantes para promover la salud y Counsellorel bienestar.  Siga las instrucciones del mdico acerca de una dieta saludable, el ejercicio y la realizacin de pruebas o exmenes para Hotel managerdetectar enfermedades.  Siga las instrucciones del mdico con respecto al control del colesterol y la presin arterial. Esta informacin no tiene Theme park managercomo fin reemplazar el consejo del mdico. Asegrese de hacerle al mdico cualquier pregunta que tenga. Document Revised: 03/27/2018 Document Reviewed: 03/27/2018 Elsevier Patient Education  2021 Elsevier Inc.      Edwina BarthMiguel Sheretta Grumbine, MD [facility]

## 2020-05-26 NOTE — Patient Instructions (Addendum)
   If you have lab work done today you will be contacted with your lab results within the next 2 weeks.  If you have not heard from us then please contact us. The fastest way to get your results is to register for My Chart.   IF you received an x-ray today, you will receive an invoice from [facility]. Please contact [facility] at [phone] with questions or concerns regarding your invoice.   IF you received labwork today, you will receive an invoice from [facility]. Please contact [facility] at [phone] with questions or concerns regarding your invoice.   Our billing staff will not be able to assist you with questions regarding bills from these companies.  You will be contacted with the lab results as soon as they are available. The fastest way to get your results is to activate your My Chart account. Instructions are located on the last page of this paperwork. If you have not heard from us regarding the results in 2 weeks, please contact this office.      Mantenimiento de la salud en los hombres Health Maintenance, Male Adoptar un estilo de vida saludable y recibir atencin preventiva son importantes para promover la salud y el bienestar. Consulte al mdico sobre:  El esquema adecuado para hacerse pruebas y exmenes peridicos.  Cosas que puede hacer por su cuenta para prevenir enfermedades y mantenerse sano. Qu debo saber sobre la dieta, el peso y el ejercicio? Consuma una dieta saludable  Consuma una dieta que incluya muchas verduras, frutas, productos lcteos con bajo contenido de grasa y protenas magras.  No consuma muchos alimentos ricos en grasas slidas, azcares agregados o sodio.   Mantenga un peso saludable El ndice de masa muscular (IMC) es una medida que puede utilizarse para identificar posibles problemas de peso. Proporciona una estimacin de la grasa corporal basndose en el peso y la altura. Su mdico puede ayudarle a determinar su IMC y a  lograr o mantener un peso saludable. Haga ejercicio con regularidad Haga ejercicio con regularidad. Esta es una de las prcticas ms importantes que puede hacer por su salud. La mayora de los adultos deben seguir estas pautas:  Realizar, al menos, 150minutos de actividad fsica por semana. El ejercicio debe aumentar la frecuencia cardaca y hacerlo transpirar (ejercicio de intensidad moderada).  Hacer ejercicios de fortalecimiento por lo menos dos veces por semana. Agregue esto a su plan de ejercicio de intensidad moderada.  Pasar menos tiempo sentados. Incluso la actividad fsica ligera puede ser beneficiosa. Controle sus niveles de colesterol y lpidos en la sangre Comience a realizarse anlisis de lpidos y colesterol en la sangre a los 20aos y luego reptalos cada 5aos. Es posible que necesite controlar los niveles de colesterol con mayor frecuencia si:  Sus niveles de lpidos y colesterol son altos.  Es mayor de 40aos.  Presenta un alto riesgo de padecer enfermedades cardacas. Qu debo saber sobre las pruebas de deteccin del cncer? Muchos tipos de cncer pueden detectarse de manera temprana y, a menudo, pueden prevenirse. Segn su historia clnica y sus antecedentes familiares, es posible que deba realizarse pruebas de deteccin del cncer en diferentes edades. Esto puede incluir pruebas de deteccin de lo siguiente:  Cncer colorrectal.  Cncer de prstata.  Cncer de piel.  Cncer de pulmn. Qu debo saber sobre la enfermedad cardaca, la diabetes y la hipertensin arterial? Presin arterial y enfermedad cardaca  La hipertensin arterial causa enfermedades cardacas y aumenta el riesgo de accidente cerebrovascular. Es ms   probable que esto se manifieste en las personas que tienen lecturas de presin arterial alta, tienen ascendencia africana o tienen sobrepeso.  Hable con el mdico sobre sus valores de presin arterial deseados.  Hgase controlar la presin  arterial: ? Cada 3 a 5 aos si tiene entre 18 y 39 aos. ? Todos los aos si es mayor de 40aos.  Si tiene entre 65 y 75 aos y es fumador o sola fumar, pregntele al mdico si debe realizarse una prueba de deteccin de aneurisma artico abdominal (AAA) por nica vez. Diabetes Realcese exmenes de deteccin de la diabetes con regularidad. Este anlisis revisa el nivel de azcar en la sangre en ayunas. Hgase las pruebas de deteccin:  Cada tresaos despus de los 45aos de edad si tiene un peso normal y un bajo riesgo de padecer diabetes.  Con ms frecuencia y a partir de una edad inferior si tiene sobrepeso o un alto riesgo de padecer diabetes. Qu debo saber sobre la prevencin de infecciones? Hepatitis B Si tiene un riesgo ms alto de contraer hepatitis B, debe someterse a un examen de deteccin de este virus. Hable con el mdico para averiguar si tiene riesgo de contraer la infeccin por hepatitis B. Hepatitis C Se recomienda un anlisis de sangre para:  Todos los que nacieron entre 1945 y 1965.  Todas las personas que tengan un riesgo de haber contrado hepatitis C. Enfermedades de transmisin sexual (ETS)  Debe realizarse pruebas de deteccin de ITS todos los aos, incluidas la gonorrea y la clamidia, si: ? Es sexualmente activo y es menor de 24aos. ? Es mayor de 24aos, y el mdico le informa que corre riesgo de tener este tipo de infecciones. ? La actividad sexual ha cambiado desde que le hicieron la ltima prueba de deteccin y tiene un riesgo mayor de tener clamidia o gonorrea. Pregntele al mdico si usted tiene riesgo.  Pregntele al mdico si usted tiene un alto riesgo de contraer VIH. El mdico tambin puede recomendarle un medicamento recetado para ayudar a evitar la infeccin por el VIH. Si elige tomar medicamentos para prevenir el VIH, primero debe hacerse los anlisis de deteccin del VIH. Luego debe hacerse anlisis cada 3meses mientras est tomando los  medicamentos. Siga estas instrucciones en su casa: Estilo de vida  No consuma ningn producto que contenga nicotina o tabaco, como cigarrillos, cigarrillos electrnicos y tabaco de mascar. Si necesita ayuda para dejar de fumar, consulte al mdico.  No consuma drogas.  No comparta agujas.  Solicite ayuda a su mdico si necesita apoyo o informacin para abandonar las drogas. Consumo de alcohol  No beba alcohol si el mdico se lo prohbe.  Si bebe alcohol: ? Limite la cantidad que consume de 0 a 2 medidas por da. ? Est atento a la cantidad de alcohol que hay en las bebidas que toma. En los [place], una medida equivale a una botella de cerveza de 12oz (355ml), un vaso de vino de 5oz (148ml) o un vaso de una bebida alcohlica de alta graduacin de 1oz (44ml). Instrucciones generales  Realcese los estudios de rutina de la salud, dentales y de la vista.  Mantngase al da con las vacunas.  Infrmele a su mdico si: ? Se siente deprimido con frecuencia. ? Alguna vez ha sido vctima de maltrato o no se siente seguro en su casa. Resumen  Adoptar un estilo de vida saludable y recibir atencin preventiva son importantes para promover la salud y el bienestar.  Siga las instrucciones del mdico   acerca de una dieta saludable, el ejercicio y la realizacin de pruebas o exmenes para detectar enfermedades.  Siga las instrucciones del mdico con respecto al control del colesterol y la presin arterial. Esta informacin no tiene como fin reemplazar el consejo del mdico. Asegrese de hacerle al mdico cualquier pregunta que tenga. Document Revised: 03/27/2018 Document Reviewed: 03/27/2018 Elsevier Patient Education  2021 Elsevier Inc.  

## 2020-05-27 LAB — LIPID PANEL
Chol/HDL Ratio: 7.2 ratio — ABNORMAL HIGH (ref 0.0–5.0)
Cholesterol, Total: 230 mg/dL — ABNORMAL HIGH (ref 100–199)
HDL: 32 mg/dL — ABNORMAL LOW (ref >39)
LDL Chol Calc (NIH): 133 mg/dL — ABNORMAL HIGH (ref 0–99)
Triglycerides: 360 mg/dL — ABNORMAL HIGH (ref 0–149)
VLDL Cholesterol Cal: 65 mg/dL — ABNORMAL HIGH (ref 5–40)

## 2020-05-27 LAB — HEMOGLOBIN A1C
Est. average glucose Bld gHb Est-mCnc: 128 mg/dL
Hgb A1c MFr Bld: 6.1 % — ABNORMAL HIGH (ref 4.8–5.6)

## 2020-05-27 LAB — COMPREHENSIVE METABOLIC PANEL
ALT: 25 IU/L (ref 0–44)
AST: 21 IU/L (ref 0–40)
Albumin/Globulin Ratio: 1.5 (ref 1.2–2.2)
Albumin: 4.2 g/dL (ref 3.8–4.9)
Alkaline Phosphatase: 74 IU/L (ref 44–121)
BUN/Creatinine Ratio: 11 (ref 9–20)
BUN: 11 mg/dL (ref 6–24)
Bilirubin Total: 0.3 mg/dL (ref 0.0–1.2)
CO2: 24 mmol/L (ref 20–29)
Calcium: 9.6 mg/dL (ref 8.7–10.2)
Chloride: 103 mmol/L (ref 96–106)
Creatinine, Ser: 0.97 mg/dL (ref 0.76–1.27)
Globulin, Total: 2.8 g/dL (ref 1.5–4.5)
Glucose: 98 mg/dL (ref 65–99)
Potassium: 4.8 mmol/L (ref 3.5–5.2)
Sodium: 140 mmol/L (ref 134–144)
Total Protein: 7 g/dL (ref 6.0–8.5)
eGFR: 92 mL/min/{1.73_m2} (ref >59)

## 2020-06-14 DIAGNOSIS — H524 Presbyopia: Secondary | ICD-10-CM | POA: Diagnosis not present

## 2020-06-17 ENCOUNTER — Encounter (HOSPITAL_COMMUNITY): Payer: Self-pay | Admitting: Radiology

## 2020-06-17 ENCOUNTER — Emergency Department (HOSPITAL_COMMUNITY): Payer: BC Managed Care – PPO

## 2020-06-17 ENCOUNTER — Emergency Department (HOSPITAL_COMMUNITY)
Admission: EM | Admit: 2020-06-17 | Discharge: 2020-06-17 | Disposition: A | Discharge status: Home / Self Care | Payer: BC Managed Care – PPO | Attending: Emergency Medicine | Admitting: Emergency Medicine

## 2020-06-17 ENCOUNTER — Ambulatory Visit (HOSPITAL_COMMUNITY)
Admission: EM | Admit: 2020-06-17 | Discharge: 2020-06-17 | Disposition: A | Discharge status: Home / Self Care | Payer: BC Managed Care – PPO | Attending: Emergency Medicine | Admitting: Emergency Medicine

## 2020-06-17 ENCOUNTER — Encounter (HOSPITAL_COMMUNITY): Payer: Self-pay

## 2020-06-17 ENCOUNTER — Other Ambulatory Visit: Payer: Self-pay

## 2020-06-17 DIAGNOSIS — R531 Weakness: Secondary | ICD-10-CM | POA: Diagnosis not present

## 2020-06-17 DIAGNOSIS — R42 Dizziness and giddiness: Secondary | ICD-10-CM | POA: Diagnosis not present

## 2020-06-17 DIAGNOSIS — Z87891 Personal history of nicotine dependence: Secondary | ICD-10-CM | POA: Diagnosis not present

## 2020-06-17 DIAGNOSIS — R112 Nausea with vomiting, unspecified: Secondary | ICD-10-CM

## 2020-06-17 DIAGNOSIS — I712 Thoracic aortic aneurysm, without rupture, unspecified: Secondary | ICD-10-CM | POA: Diagnosis present

## 2020-06-17 DIAGNOSIS — J449 Chronic obstructive pulmonary disease, unspecified: Secondary | ICD-10-CM | POA: Insufficient documentation

## 2020-06-17 LAB — CBG MONITORING, ED: Glucose-Capillary: 179 mg/dL — ABNORMAL HIGH (ref 70–99)

## 2020-06-17 LAB — COMPREHENSIVE METABOLIC PANEL
ALT: 34 U/L (ref 0–44)
AST: 30 U/L (ref 15–41)
Albumin: 4.1 g/dL (ref 3.5–5.0)
Alkaline Phosphatase: 63 U/L (ref 38–126)
Anion gap: 9 (ref 5–15)
BUN: 10 mg/dL (ref 6–20)
CO2: 25 mmol/L (ref 22–32)
Calcium: 9.4 mg/dL (ref 8.9–10.3)
Chloride: 103 mmol/L (ref 98–111)
Creatinine, Ser: 0.87 mg/dL (ref 0.61–1.24)
GFR, Estimated: >60 mL/min (ref >60)
Glucose, Bld: 166 mg/dL — ABNORMAL HIGH (ref 70–99)
Potassium: 3.5 mmol/L (ref 3.5–5.1)
Sodium: 137 mmol/L (ref 135–145)
Total Bilirubin: 1 mg/dL (ref 0.3–1.2)
Total Protein: 7.5 g/dL (ref 6.5–8.1)

## 2020-06-17 LAB — CBC
HCT: 47.8 % (ref 39.0–52.0)
Hemoglobin: 15.3 g/dL (ref 13.0–17.0)
MCH: 27.6 pg (ref 26.0–34.0)
MCHC: 32 g/dL (ref 30.0–36.0)
MCV: 86.1 fL (ref 80.0–100.0)
Platelets: 306 10*3/uL (ref 150–400)
RBC: 5.55 MIL/uL (ref 4.22–5.81)
RDW: 12.9 % (ref 11.5–15.5)
WBC: 10.7 10*3/uL — ABNORMAL HIGH (ref 4.0–10.5)
nRBC: 0 % (ref 0.0–0.2)

## 2020-06-17 LAB — TROPONIN I (HIGH SENSITIVITY)
Troponin I (High Sensitivity): 2 ng/L (ref <18)
Troponin I (High Sensitivity): <2 ng/L (ref <18)

## 2020-06-17 LAB — LIPASE, BLOOD: Lipase: 32 U/L (ref 11–51)

## 2020-06-17 MED ORDER — IOHEXOL 350 MG/ML SOLN
75.0000 mL | Freq: Once | INTRAVENOUS | Status: AC | PRN
  Administered 2020-06-17: 75 mL via INTRAVENOUS

## 2020-06-17 MED ORDER — ASPIRIN 81 MG PO CHEW
324.0000 mg | CHEWABLE_TABLET | Freq: Once | ORAL | Status: AC
  Administered 2020-06-17: 324 mg via ORAL
  Filled 2020-06-17: qty 4

## 2020-06-17 MED ORDER — ONDANSETRON 4 MG PO TBDP
ORAL_TABLET | ORAL | Status: AC
  Filled 2020-06-17: qty 1

## 2020-06-17 MED ORDER — ONDANSETRON 4 MG PO TBDP
4.0000 mg | ORAL_TABLET | Freq: Once | ORAL | Status: AC
  Administered 2020-06-17: 4 mg via ORAL

## 2020-06-17 MED ORDER — MECLIZINE HCL 25 MG PO TABS
25.0000 mg | ORAL_TABLET | Freq: Three times a day (TID) | ORAL | 0 refills | Status: DC | PRN
Start: 2020-06-17 — End: 2021-05-12

## 2020-06-17 MED ORDER — MECLIZINE HCL 25 MG PO TABS
25.0000 mg | ORAL_TABLET | Freq: Once | ORAL | Status: AC
  Administered 2020-06-17: 25 mg via ORAL
  Filled 2020-06-17: qty 1

## 2020-06-17 MED ORDER — SODIUM CHLORIDE 0.9 % IV SOLN: INTRAVENOUS | Status: AC

## 2020-06-17 NOTE — ED Notes (Signed)
Pt transport to MRI

## 2020-06-17 NOTE — Discharge Instructions (Addendum)
IMPRESSION:  1. No evidence of acute intracranial abnormality.  2. No emergent large vessel occlusion or hemodynamically significant  proximal stenosis in the head or neck.  3. 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 Recommend annual imaging followup by CTA or MRA. This  recommendation follows 2010  ACCF/AHA/AATS/ACR/ASA/SCA/SCAI/SIR/STS/SVM Guidelines for the  Diagnosis and Management of Patients with Thoracic Aortic Disease.  Circulation.2010; 121: E010-O712. Aortic aneurysm NOS (ICD10-I71.9)

## 2020-06-17 NOTE — ED Notes (Signed)
Patient is being discharged from the Urgent Care and sent to the Emergency Department via POV . Per Ermalinda Memos, patient is in need of higher level of care due to dizziness and vomiting. Patient is aware and verbalizes understanding of plan of care.  Vitals:   06/17/20 1055  BP: (!) 152/98  Pulse: 92  Resp: 20  Temp: 97.6 F (36.4 C)  SpO2: 95%

## 2020-06-17 NOTE — ED Triage Notes (Signed)
Pt reports dizziness "room spinning",nausea, blurry vision weak and high blood pressure x 1 day. States dizziness improved "a little" when eye closed. Denies chest pain, shortness of breath, abdominal pain, diarrhea, headache,

## 2020-06-17 NOTE — ED Provider Notes (Signed)
[facility]    CSN: [id number] Arrival date & time: 06/17/20  1038      History   Chief Complaint Chief Complaint  Patient presents with  . Dizziness  . Emesis    HPI Scott Hanna is a 54 y.o. male.   Accompanied by his wife, patient presents with dizziness, generalized weakness, nausea, vomiting since yesterday evening.  The nausea and vomiting are worse when he attempts to sit or stand.  He reports the dizziness feels like the room spinning but he also feels lightheaded.  He also reports elevated blood pressure reading.  He denies chest pain, shortness of breath, abdominal pain, diarrhea, or other symptoms.  No treatments attempted at home.  His medical history includes COPD.    The history is provided by the patient and medical records.    History reviewed. No pertinent past medical history.  Patient Active Problem List   Diagnosis Date Noted  . Chronic obstructive pulmonary disease (HCC) 02/05/2020    History reviewed. No pertinent surgical history.     Home Medications    Prior to Admission medications   Medication Sig Start Date End Date Taking? Authorizing Provider  aspirin EC 81 MG tablet Take 81 mg by mouth daily. Once he remembers   Yes [provider]  rosuvastatin (CRESTOR) 20 MG tablet Take 1 tablet (20 mg total) by mouth daily. 09/19/19  Yes Georgina Quint, MD  buPROPion [facility] SR) 150 MG 12 hr tablet Take 1 tablet (150 mg total) by mouth daily. 09/19/19 12/18/19  Georgina Quint, MD    Family History History reviewed. No pertinent family history.  Social History Social History   Tobacco Use  . Smoking status: Former Smoker    Packs/day: 1.00    Types: Cigarettes  . Smokeless tobacco: Never Used  . Tobacco comment: 6 months ago stopped smoking.    Substance Use Topics  . Alcohol use: Not Currently  . Drug use: Never     Allergies   Patient has no known allergies.   Review of Systems Review of  Systems  Constitutional: Negative for chills and fever.  HENT: Negative for ear pain and sore throat.   Eyes: Negative for pain and visual disturbance.  Respiratory: Negative for cough and shortness of breath.   Cardiovascular: Negative for chest pain and palpitations.  Gastrointestinal: Positive for nausea and vomiting. Negative for abdominal pain.  Genitourinary: Negative for dysuria and hematuria.  Musculoskeletal: Negative for arthralgias and back pain.  Skin: Negative for color change and rash.  Neurological: Positive for dizziness, weakness and light-headedness. Negative for syncope, facial asymmetry, speech difficulty, numbness and headaches.  All other systems reviewed and are negative.    Physical Exam Triage Vital Signs ED Triage Vitals  Enc Vitals Group     BP      Pulse      Resp      Temp      Temp src      SpO2      Weight      Height      Head Circumference      Peak Flow      Pain Score      Pain Loc      Pain Edu?      Excl. in GC?    No data found.  Updated Vital Signs BP (!) 152/98 (BP Location: Left Arm)   Pulse 92   Temp 97.6 F (36.4 C)   Resp  20   SpO2 95%   Visual Acuity Right Eye Distance:   Left Eye Distance:   Bilateral Distance:    Right Eye Near:   Left Eye Near:    Bilateral Near:     Physical Exam Vitals and nursing note reviewed.  Constitutional:      General: He is in acute distress.     Appearance: He is well-developed. He is ill-appearing.  HENT:     Head: Normocephalic and atraumatic.     Mouth/Throat:     Mouth: Mucous membranes are moist.  Eyes:     Conjunctiva/sclera: Conjunctivae normal.  Cardiovascular:     Rate and Rhythm: Normal rate and regular rhythm.     Heart sounds: Normal heart sounds.  Pulmonary:     Effort: Pulmonary effort is normal. No respiratory distress.     Breath sounds: Normal breath sounds.  Abdominal:     General: Bowel sounds are normal.     Palpations: Abdomen is soft.      Tenderness: There is no abdominal tenderness. There is no guarding or rebound.  Musculoskeletal:     Cervical back: Neck supple.  Skin:    General: Skin is warm and dry.  Neurological:     General: No focal deficit present.     Mental Status: He is alert and oriented to person, place, and time.     Sensory: No sensory deficit.     Motor: No weakness.  Psychiatric:        Mood and Affect: Mood normal.        Behavior: Behavior normal.      UC Treatments / Results  Labs (all labs ordered are listed, but only abnormal results are displayed) Labs Reviewed - No data to display  EKG   Radiology No results found.  Procedures Procedures (including critical care time)  Medications Ordered in UC Medications  ondansetron (ZOFRAN-ODT) disintegrating tablet 4 mg (4 mg Oral Given 06/17/20 1128)    Initial Impression / Assessment and Plan / UC Course  I have reviewed the triage vital signs and the nursing notes.  Pertinent labs & imaging results that were available during my care of the patient were reviewed by me and considered in my medical decision making (see chart for details).   Dizziness, generalized weakness, nausea with intractable vomiting.  EKG shows sinus rhythm, rate 76; no ST elevation; no previous to compare.  Patient is unable to sit up without vomiting and worsening of his dizziness.  He has generalized weakness; no focal weakness.  He appears ill.  Sending him to the ED for evaluation.  He declines EMS.  His wife drove him here and will drive him to the ED.  Final Clinical Impressions(s) / UC Diagnoses   Final diagnoses:  Dizziness  Intractable vomiting with nausea, unspecified vomiting type  Generalized weakness     Discharge Instructions     Go to the emergency department for evaluation of your dizziness, intractable vomiting, generalized weakness.    ED Prescriptions    None     PDMP not reviewed this encounter.   Mickie Bail, NP 06/17/20  1134

## 2020-06-17 NOTE — ED Notes (Signed)
REPORT FROM PEGGY AT THIS TIME 

## 2020-06-17 NOTE — ED Provider Notes (Signed)
[facility] EMERGENCY DEPARTMENT Provider Note   CSN: [id number] Arrival date & time: 06/17/20  1137     History Chief Complaint  Patient presents with  . Dizziness    Scott Hanna is a 55 y.o. male  With a past medical history of COPD presents for evaluation of dizziness with nausea and vomiting.  He reports that last night began having these sensations.  He has vomited once.  He has had multiple bouts of dry heaves.  He has never had anything similar.  He states that he feels like things are moving, not like he is going to pass out.  He denies any visual changes or changes in hearing.  He did note that he felt a abnormal sensation when he took his earbud out yesterday however is not sure which ear that was not denies any ongoing abnormal ear sensations.  No recent ear infections.  No changes in his hearing or ringing in his ears. He states that it feels like he has had too many alcoholic drinks when he attempts to walk.  He feels very off balance and stumbling however states that he has not had any falls. He denies any trauma or chiropractic adjustments.  No weakness.  Of note he took his first dose of pravastatin yesterday.   HPI     History reviewed. No pertinent past medical history.  Patient Active Problem List   Diagnosis Date Noted  . Thoracic aortic aneurysm without rupture (HCC) 06/17/2020  . Chronic obstructive pulmonary disease (HCC) 02/05/2020    No past surgical history on file.     No family history on file.  Social History   Tobacco Use  . Smoking status: Former Smoker    Packs/day: 1.00    Types: Cigarettes  . Smokeless tobacco: Never Used  . Tobacco comment: 6 months ago stopped smoking.    Substance Use Topics  . Alcohol use: Not Currently  . Drug use: Never    Home Medications Prior to Admission medications   Medication Sig Start Date End Date Taking? Authorizing Provider  meclizine (ANTIVERT) 25 MG tablet Take 1-2  tablets (25-50 mg total) by mouth 3 (three) times daily as needed for dizziness. 06/17/20  Yes Cristina Gong, PA-C  buPROPion [facility] SR) 150 MG 12 hr tablet Take 1 tablet (150 mg total) by mouth daily. Patient not taking: Reported on 06/17/2020 09/19/19 12/18/19  Georgina Quint, MD  rosuvastatin (CRESTOR) 20 MG tablet Take 1 tablet (20 mg total) by mouth daily. Patient not taking: Reported on 06/17/2020 09/19/19   Georgina Quint, MD    Allergies    Patient has no known allergies.  Review of Systems   Review of Systems  Constitutional: Negative for chills and fever.  HENT: Negative for congestion.   Eyes: Negative for photophobia, pain and visual disturbance.  Respiratory: Negative for chest tightness and shortness of breath.   Cardiovascular: Negative for chest pain.  Gastrointestinal: Positive for nausea and vomiting. Negative for abdominal distention, abdominal pain and diarrhea.  Musculoskeletal: Negative for back pain and neck pain.  Skin: Negative for color change and rash.  Neurological: Positive for dizziness. Negative for syncope, facial asymmetry, speech difficulty, weakness, numbness and headaches.  Psychiatric/Behavioral: Negative for confusion.  All other systems reviewed and are negative.   Physical Exam Updated Vital Signs BP (!) 159/102 (BP Location: Left Arm)   Pulse 85   Temp 98.3 F (36.8 C) (Oral)   Resp 20  Ht 6' (1.829 m)   Wt 104.3 kg   SpO2 97%   BMI 31.19 kg/m   Physical Exam Vitals and nursing note reviewed.  Constitutional:      General: He is not in acute distress.    Appearance: He is not diaphoretic.  HENT:     Head: Normocephalic and atraumatic.     Right Ear: Tympanic membrane, ear canal and external ear normal.     Left Ear: Tympanic membrane, ear canal and external ear normal.     Nose: Nose normal.  Eyes:     General: No scleral icterus.       Right eye: No discharge.        Left eye: No discharge.      Conjunctiva/sclera: Conjunctivae normal.  Cardiovascular:     Rate and Rhythm: Normal rate and regular rhythm.  Pulmonary:     Effort: Pulmonary effort is normal. No respiratory distress.     Breath sounds: No stridor.  Abdominal:     General: There is no distension.     Tenderness: There is no abdominal tenderness. There is no guarding.  Musculoskeletal:        General: No deformity.     Cervical back: Normal range of motion and neck supple. No tenderness.     Right lower leg: No edema.     Left lower leg: No edema.  Skin:    General: Skin is warm and dry.  Neurological:     Mental Status: He is alert.     Motor: No abnormal muscle tone.     Comments: Patient is awake is oriented to person, place, and time.  He answers questions appropriately.  Speech is not slurred.  5/5 grip strength bilaterally.  Facial sensation grossly intact to light touch and facial movements are grossly symmetric. Normal finger-nose-finger bilaterally.  He has rotational nystagmus with bilateral eye movements.  This is worse when looking to the left than to the right. Questionable truncal ataxia when sitting.  Psychiatric:        Mood and Affect: Mood normal.        Behavior: Behavior normal.     ED Results / Procedures / Treatments   Labs (all labs ordered are listed, but only abnormal results are displayed) Labs Reviewed  CBC - Abnormal; Notable for the following components:      Result Value   WBC 10.7 (*)    All other components within normal limits  COMPREHENSIVE METABOLIC PANEL - Abnormal; Notable for the following components:   Glucose, Bld 166 (*)    All other components within normal limits  CBG MONITORING, ED - Abnormal; Notable for the following components:   Glucose-Capillary 179 (*)    All other components within normal limits  LIPASE, BLOOD  URINALYSIS, ROUTINE W REFLEX MICROSCOPIC  TROPONIN I (HIGH SENSITIVITY)  TROPONIN I (HIGH SENSITIVITY)    EKG EKG  Interpretation  Date/Time:  Thursday June 17 2020 11:48:46 EDT Ventricular Rate:  77 PR Interval:  162 QRS Duration: 92 QT Interval:  422 QTC Calculation: 477 R Axis:   64 Text Interpretation: Normal sinus rhythm Normal ECG Confirmed by Coralee PesaHorton, Kristie [phone]([id number]) on 06/17/2020 1:10:35 PM   Radiology CT Angio Head W/Cm &/Or Wo Cm  Result Date: 06/17/2020 CLINICAL DATA:  Dizziness. EXAM: CT ANGIOGRAPHY HEAD AND NECK TECHNIQUE: Multidetector CT imaging of the head and neck was performed using the standard protocol during bolus administration of intravenous contrast. Multiplanar CT image reconstructions and MIPs  were obtained to evaluate the vascular anatomy. Carotid stenosis measurements (when applicable) are obtained utilizing NASCET criteria, using the distal internal carotid diameter as the denominator. CONTRAST:  52mL OMNIPAQUE IOHEXOL 350 MG/ML SOLN COMPARISON:  None. FINDINGS: CT HEAD FINDINGS Brain: No evidence of acute large vascular territory infarction, hemorrhage, hydrocephalus, extra-axial collection or mass lesion/mass effect. Vascular: No hyperdense vessel. Skull: No acute fracture. Sinuses: Small amount of frothy secretions in the left sphenoid sinus. Otherwise, visualized sinuses are clear. Unremarkable orbits. Orbits: No mastoid effusions. CTA NECK FINDINGS Aortic arch: The visualized aortic arch is dilated up to 3.5 cm. No significant stenosis of the major arch vessel origins. Right carotid system: No evidence of dissection, stenosis (50% or greater) or occlusion. Retropharyngeal course. Left carotid system: No evidence of dissection, stenosis (50% or greater) or occlusion. Tortuous left internal carotid artery which courses anteriorly at the craniocervical junction and is positioned along the anterolateral aspect of the left adenoid at the skull base (series 11, image 149). Vertebral arteries: Left dominant. No evidence of dissection, stenosis (50% or greater) or occlusion. Skeleton: Mild  multilevel degenerative change of the cervical spine. Other neck: No suspicious adenopathy. Upper chest: Clear lung apices. Review of the MIP images confirms the above findings CTA HEAD FINDINGS Anterior circulation: No large vessel occlusion, proximal hemodynamically significant stenosis, or aneurysm. Posterior circulation: No large vessel occlusion, proximal hemodynamically significant stenosis, or aneurysm. Venous sinuses: As permitted by contrast timing, patent. Review of the MIP images confirms the above findings IMPRESSION: 1. No evidence of acute intracranial abnormality. 2. No emergent large vessel occlusion or hemodynamically significant proximal stenosis in the head or neck. 3. Mild aneurysmal dilation of the visualized aortic arch up to 3.5 cm. Recommend annual imaging followup by CTA or MRA. This recommendation follows 2010 ACCF/AHA/AATS/ACR/ASA/SCA/SCAI/SIR/STS/SVM Guidelines for the Diagnosis and Management of Patients with Thoracic Aortic Disease. Circulation.2010; 121: O160-V371. Aortic aneurysm NOS (ICD10-I71.9) Electronically Signed   By: Feliberto Harts MD   On: 06/17/2020 15:09   CT Angio Neck W and/or Wo Contrast  Result Date: 06/17/2020 CLINICAL DATA:  Dizziness. EXAM: CT ANGIOGRAPHY HEAD AND NECK TECHNIQUE: Multidetector CT imaging of the head and neck was performed using the standard protocol during bolus administration of intravenous contrast. Multiplanar CT image reconstructions and MIPs were obtained to evaluate the vascular anatomy. Carotid stenosis measurements (when applicable) are obtained utilizing NASCET criteria, using the distal internal carotid diameter as the denominator. CONTRAST:  22mL OMNIPAQUE IOHEXOL 350 MG/ML SOLN COMPARISON:  None. FINDINGS: CT HEAD FINDINGS Brain: No evidence of acute large vascular territory infarction, hemorrhage, hydrocephalus, extra-axial collection or mass lesion/mass effect. Vascular: No hyperdense vessel. Skull: No acute fracture. Sinuses:  Small amount of frothy secretions in the left sphenoid sinus. Otherwise, visualized sinuses are clear. Unremarkable orbits. Orbits: No mastoid effusions. CTA NECK FINDINGS Aortic arch: The visualized aortic arch is dilated up to 3.5 cm. No significant stenosis of the major arch vessel origins. Right carotid system: No evidence of dissection, stenosis (50% or greater) or occlusion. Retropharyngeal course. Left carotid system: No evidence of dissection, stenosis (50% or greater) or occlusion. Tortuous left internal carotid artery which courses anteriorly at the craniocervical junction and is positioned along the anterolateral aspect of the left adenoid at the skull base (series 11, image 149). Vertebral arteries: Left dominant. No evidence of dissection, stenosis (50% or greater) or occlusion. Skeleton: Mild multilevel degenerative change of the cervical spine. Other neck: No suspicious adenopathy. Upper chest: Clear lung apices. Review of the MIP  images confirms the above findings CTA HEAD FINDINGS Anterior circulation: No large vessel occlusion, proximal hemodynamically significant stenosis, or aneurysm. Posterior circulation: No large vessel occlusion, proximal hemodynamically significant stenosis, or aneurysm. Venous sinuses: As permitted by contrast timing, patent. Review of the MIP images confirms the above findings IMPRESSION: 1. No evidence of acute intracranial abnormality. 2. No emergent large vessel occlusion or hemodynamically significant proximal stenosis in the head or neck. 3. Mild aneurysmal dilation of the visualized aortic arch up to 3.5 cm. Recommend annual imaging followup by CTA or MRA. This recommendation follows 2010 ACCF/AHA/AATS/ACR/ASA/SCA/SCAI/SIR/STS/SVM Guidelines for the Diagnosis and Management of Patients with Thoracic Aortic Disease. Circulation.2010; 121: Y403-K742. Aortic aneurysm NOS (ICD10-I71.9) Electronically Signed   By: Feliberto Harts MD   On: 06/17/2020 15:09   MR BRAIN  WO CONTRAST  Result Date: 06/17/2020 CLINICAL DATA:  Dizziness EXAM: MRI HEAD WITHOUT CONTRAST TECHNIQUE: Multiplanar, multiecho pulse sequences of the brain and surrounding structures were obtained without intravenous contrast. COMPARISON:  None. FINDINGS: Brain: There is no acute infarction or intracranial hemorrhage. There is no intracranial mass, mass effect, or edema. There is no hydrocephalus or extra-axial fluid collection. Ventricles and sulci are within normal limits in size and configuration. Vascular: Major vessel flow voids at the skull base are preserved. Skull and upper cervical spine: Normal marrow signal is preserved. Sinuses/Orbits: Paranasal sinuses are aerated. Orbits are unremarkable. Other: Sella is unremarkable.  Mastoid air cells are clear. IMPRESSION: No evidence of recent infarction, hemorrhage, or mass. Electronically Signed   By: Guadlupe Spanish M.D.   On: 06/17/2020 16:44    Procedures Procedures   Medications Ordered in ED Medications  0.9 %  sodium chloride infusion ( Intravenous Stopped 06/17/20 2006)  meclizine (ANTIVERT) tablet 25 mg (25 mg Oral Given 06/17/20 1304)  aspirin chewable tablet 324 mg (324 mg Oral Given 06/17/20 1756)  iohexol (OMNIPAQUE) 350 MG/ML injection 75 mL (75 mLs Intravenous Contrast Given 06/17/20 1434)  meclizine (ANTIVERT) tablet 25 mg (25 mg Oral Given 06/17/20 1757)    ED Course  I have reviewed the triage vital signs and the nursing notes.  Pertinent labs & imaging results that were available during my care of the patient were reviewed by me and considered in my medical decision making (see chart for details).  Clinical Course as of 06/17/20 2224  Thu Jun 17, 2020  1323 Paged neurology [EH]  [phone] Spoke with sal with neurology, he reccomended  Aspirin, CTA head and neck and MRI brain without contrast.  [EH]  1713 Patient reevaluated, he is still slightly dizzy however feels much improved.  His CTA and MRI was reassuring.  Will give  second dose of meclizine and attempt to ambulate patient when his troponin comes back. [EH]    Clinical Course User Index [EH] Norman Clay   MDM Rules/Calculators/A&P                         Patient is a 55 year old man who presents today for evaluation of dizziness that started last night. On my initial exam he has nystagmus with concern for rotational nystagmus. Labs are obtained and reviewed, white count is slightly elevated however otherwise CBC, CMP, lipase are all unremarkable.  UA was not obtained while he was in the ER.  He is slightly hypertensive while in the emergency room however otherwise his vitals are stable and reassuring.  Troponin x2 is negative. He was treated with meclizine while in the emergency  room after which he had improvement in his symptoms however still felt slightly dizzy.  He was then given a second dose of 25 mg meclizine after which his dizziness fully resolved. After discussions with neurology patient was ordered a one-time dose of aspirin, along with CTA head and neck and MRI brain without contrast.  Radiology was reviewed, he does have a relatively small thoracic aortic aneurysm without evidence of fracture and he is not having chest pain.  Otherwise his MRI and CTAs were without cause for his symptoms found or evidence of stroke or other suggestion of a central cause. After meclizine he was able to ambulate in the emergency room without ataxia or difficulties.  Patient will be discharged home.  He is given a prescription for meclizine, instructed not to drive or operate heavy machinery or perform other potentially dangerous tasks while taking this or while he feels dizzy.  Professional Spanish medical interpreter was used to discuss results with patient and plan of care in addition to he is well obtaining HPI.  Return precautions were discussed with patient who states their understanding.  At the time of discharge patient denied any unaddressed  complaints or concerns.  Patient is agreeable for discharge home.  Note: Portions of this report may have been transcribed using voice recognition software. Every effort was made to ensure accuracy; however, inadvertent computerized transcription errors may be present  Final Clinical Impression(s) / ED Diagnoses Final diagnoses:  Dizziness  Thoracic aortic aneurysm without rupture [facility])    Rx / DC Orders ED Discharge Orders         Ordered    meclizine (ANTIVERT) 25 MG tablet  3 times daily PRN        06/17/20 1911           Norman Clay 06/17/20 2227    Rozelle Logan, DO 06/19/20 1012

## 2020-06-17 NOTE — ED Notes (Signed)
wpt d/c and rolled out side via wheelchair. While waiting for vehicle pt began to vomit. Called md and made aware of same however pt did not wish to return to the ed he got in his vehicle and left

## 2020-06-17 NOTE — Discharge Instructions (Addendum)
Go to the emergency department for evaluation of your dizziness, intractable vomiting, generalized weakness.

## 2020-06-17 NOTE — ED Triage Notes (Signed)
Pt reports dizziness and nausea since last night. Denies recent fever. Pt reported 1 episode of non bloody emesis. No distress at present.

## 2020-08-04 ENCOUNTER — Ambulatory Visit: Payer: Self-pay | Admitting: Emergency Medicine

## 2020-08-19 ENCOUNTER — Other Ambulatory Visit: Payer: Self-pay

## 2020-08-19 ENCOUNTER — Ambulatory Visit: Payer: BC Managed Care – PPO | Admitting: Emergency Medicine

## 2020-08-19 ENCOUNTER — Encounter: Payer: Self-pay | Admitting: Emergency Medicine

## 2020-08-19 VITALS — BP 130/80 | HR 85 | Temp 98.5°F | Ht 72.0 in | Wt 233.4 lb

## 2020-08-19 DIAGNOSIS — N3943 Post-void dribbling: Secondary | ICD-10-CM | POA: Insufficient documentation

## 2020-08-19 DIAGNOSIS — Z1211 Encounter for screening for malignant neoplasm of colon: Secondary | ICD-10-CM

## 2020-08-19 DIAGNOSIS — E785 Hyperlipidemia, unspecified: Secondary | ICD-10-CM | POA: Diagnosis not present

## 2020-08-19 DIAGNOSIS — I712 Thoracic aortic aneurysm, without rupture, unspecified: Secondary | ICD-10-CM

## 2020-08-19 DIAGNOSIS — J410 Simple chronic bronchitis: Secondary | ICD-10-CM | POA: Diagnosis not present

## 2020-08-19 MED ORDER — ROSUVASTATIN CALCIUM 20 MG PO TABS: 20.0000 mg | ORAL_TABLET | Freq: Every day | ORAL | 3 refills | Status: DC

## 2020-08-19 MED ORDER — GUAIFENESIN ER 600 MG PO TB12: 1200.0000 mg | ORAL_TABLET | Freq: Two times a day (BID) | ORAL | 5 refills | Status: DC | PRN

## 2020-08-19 NOTE — Assessment & Plan Note (Signed)
Abnormal recent lipid profile.  Needs to start rosuvastatin 20 mg daily.  Diet and nutrition discussed.

## 2020-08-19 NOTE — Patient Instructions (Signed)
[email or web address]">  Bronquitis crnica en los adultos Chronic Bronchitis, Adult  La bronquitis crnica es una inflamacin de las vas respiratorias grandes (conductos bronquiales) que transportan el aire a los pulmones. La inflamacin provoca que se acumule ms mucosidad (esputo). La inflamacin y la mucosidad dificultan la respiracin. Esta afeccin es un tipo de enfermedad pulmonar obstructiva crnica (EPOC). La bronquitis crnica es una afeccin de largo plazo (crnica). Se define como tos crnica con produccin de esputo:  Durante al menos 3 meses del ao.  Durante 2 aos seguidos. Las personas con bronquitis crnica tienen ms probabilidades de Warehouse manager resfros y otras infecciones en la nariz, la garganta o las vas respiratorias. Cules son las causas? Las causas de esta afeccin son, con Chief Financial Officer, las siguientes:  Antecedentes de tabaquismo.  Exposicin al humo ambiental de tabaco o a una zona con humo durante un perodo de [employer].  Problemas pulmonares, como enfisema, asma, bronquiectasia o fibrosis qustica.  Exposicin prolongada a ciertos vapores o sustancias qumicas que irritan los pulmones.  Infeccin. Cules son los signos o sntomas? Los sntomas de la bronquitis crnica pueden incluir:  Tos que provoca mucosidad (tos productiva).  Falta de aire.  Emitir sonidos sibilantes al respirar (sibilancias).  Opresin en el pecho.  Resfro o infecciones respiratorias que desaparecen y regresan. Cmo se diagnostica? Esta afeccin se puede diagnosticar en funcin de lo siguiente:  Los sntomas y los antecedentes mdicos.  Un examen fsico.  Pruebas, por ejemplo: ? Anlisis de [employer] de esputo. ? Anlisis de [place]. ? Radiografa de trax. ? Pruebas del funcionamiento de los pulmones (pulmonar). Cmo se trata? La bronquitis crnica no tiene [place]. El tratamiento puede ayudar a Chief Operating Officer los sntomas. Esto puede  comprender lo siguiente:  Beber lquidos. Esto puede ayudar a diluir la mucosidad de modo que sea ms fcil expectorarla.  Tcnicas para despejar la mucosidad.  Tomar medicamentos recetados por el mdico, por ejemplo: ? Medicamentos inhalados para mejorar el flujo de aire que entra y sale de los pulmones. ? Antibiticos para tratar o prevenir las infecciones pulmonares bacterianas.  Usar oxigenoterapia, si el nivel de oxgeno en la sangre es [place].  Rehabilitacin pulmonar. Es un programa que ayuda a aprender a Aeronautical engineer. El programa puede incluir ejercicios, [place], [place], tratamiento y apoyo. Siga estas instrucciones en su casa: Medicamentos  Use los medicamentos de venta libre y los recetados solamente como se lo haya indicado el mdico.  Si le recetaron un antibitico, tmelo como se lo haya indicado el mdico. No deje de tomar el antibitico aunque comience a sentirse mejor. Estilo de vida  No consuma ningn producto que contenga nicotina o tabaco, como cigarrillos, cigarrillos electrnicos y tabaco de Theatre manager. Si necesita ayuda para dejar de fumar, consulte al mdico.  Mantngase lejos del humo de otras personas (humo ambiental de tabaco) y de cualquier irritante que le provoque ms tos, como los vapores de productos qumicos.  Siga una dieta saludable y haga ejercicio con regularidad. Hable con el mdico acerca de cules actividades son seguras para usted.   Prevencin de infecciones  Mantngase al da con todas las vacunas, incluso la vacuna contra la neumona y la vacuna contra la gripe.  Lvese las manos frecuentemente con agua y jabn durante al menos 20segundos. Use desinfectante para manos si no dispone de [place] y [place].  Evite el contacto con personas que tengan sntomas de resfro o gripe. Instrucciones generales  Beba suficiente lquido para mantener la orina de color amarillo plido.  Use oxigenoterapia en su hogar segn las  indicaciones. ? Siga las indicaciones del mdico acerca de cmo usar oxgeno de manera segura y tome medidas para evitar incendios. ? No fume mientras [place] oxgeno ni permita que otras personas fumen en su casa.  Concurra a todas las visitas de [address] se lo haya indicado el mdico. Esto es importante.   Comunquese con un mdico si:  La falta de aire o la tos empeoran incluso cuando toma medicamentos.  La mucosidad se vuelve ms espesa o cambia de color.  No puede expulsar la mucosidad al toser.  Tiene fiebre. Solicite ayuda de inmediato si:  Tiene dificultad para respirar.  Siente dolor en el pecho.  Se siente mareado o confundido. Estos sntomas pueden representar un problema grave que constituye Radio broadcast assistant. No espere a ver si los sntomas desaparecen. Solicite atencin mdica de inmediato. Comunquese con el servicio de emergencias de su localidad (911 en los [place]). No conduzca por sus propios medios [employer]. Resumen  La bronquitis crnica es una inflamacin de las vas respiratorias grandes (conductos bronquiales) que transportan el aire a los pulmones. La inflamacin provoca que se acumule mucosidad (esputo). La inflamacin y la mucosidad dificultan la respiracin.  Si le recetaron un antibitico, tmelo como se lo haya indicado el mdico. No deje de tomar el antibitico aunque comience a sentirse mejor.  Beba suficiente lquido para Photographer orina de color amarillo plido. Beber lquidos puede ayudar a diluir la mucosidad de modo que sea ms fcil expectorarla.  No consuma ningn producto que contenga nicotina o tabaco, como cigarrillos, cigarrillos electrnicos y tabaco de Theatre manager. Si necesita ayuda para dejar de fumar, consulte al mdico. Esta informacin no tiene Theme park manager el consejo del mdico. Asegrese de hacerle al mdico cualquier pregunta que tenga. Document Revised: 06/23/2019 Document Reviewed: 06/23/2019 Elsevier Patient  Education  2021 [employer].

## 2020-08-19 NOTE — Assessment & Plan Note (Signed)
Mostly chronic bronchitis with excessive mucus production.  May benefit from Mucinex daily as needed.  Functional capacity still pretty good.  No longer smoking.

## 2020-08-19 NOTE — Progress Notes (Signed)
Scott Hanna 55 y.o.   Chief Complaint  Patient presents with  . Follow-up    6 months   Lab Results  Component Value Date   CHOL 230 (H) 05/26/2020   HDL 32 (L) 05/26/2020   LDLCALC 133 (H) 05/26/2020   TRIG 360 (H) 05/26/2020   CHOLHDL 7.2 (H) 05/26/2020   The 10-year ASCVD risk score [name] DC Jr., et al., 2013) is: 14.8%   Values used to calculate the score:     Age: 31 years     Sex: Male     Is Non-Hispanic African American: No     Diabetic: No     Tobacco smoker: No     Systolic Blood Pressure: 159 mmHg     Is BP treated: No     HDL Cholesterol: 32 mg/dL     Total Cholesterol: 230 mg/dL  HISTORY OF PRESENT ILLNESS: This is a 55 y.o. male with history of COPD, mostly chronic bronchitis, 30-year smoking, stopped 7 months ago, here for follow-up. Also has history of dyslipidemia but not taking rosuvastatin 20 mg daily as prescribed. Also complaining of urinary dribbling following urination for several weeks.  Needs urology referral. Also complaining of insomnia.  We will try melatonin 10 mg daily. Was recently in the emergency room complaining of dizziness and had the following work-up done: EXAM: CT ANGIOGRAPHY HEAD AND NECK IMPRESSION: 1. No evidence of acute intracranial abnormality. 2. No emergent large vessel occlusion or hemodynamically significant proximal stenosis in the head or neck. 3. Mild aneurysmal dilation of the visualized aortic arch up to 3.5 cm. Recommend annual imaging followup by CTA or MRA. This recommendation follows 2010 ACCF/AHA/AATS/ACR/ASA/SCA/SCAI/SIR/STS/SVM Guidelines for the Diagnosis and Management of Patients with Thoracic Aortic Disease. Circulation.2010; 121: G295-M841. Aortic aneurysm NOS (ICD10-I71.9)     Electronically Signed   By: Feliberto Harts MD   On: 06/17/2020 15:09 CLINICAL DATA:  Dizziness   EXAM: MRI HEAD WITHOUT CONTRAST   TECHNIQUE: Multiplanar, multiecho pulse sequences of the brain and  surrounding structures were obtained without intravenous contrast.   COMPARISON:  None.   FINDINGS: Brain: There is no acute infarction or intracranial hemorrhage. There is no intracranial mass, mass effect, or edema. There is no hydrocephalus or extra-axial fluid collection. Ventricles and sulci are within normal limits in size and configuration.   Vascular: Major vessel flow voids at the skull base are preserved.   Skull and upper cervical spine: Normal marrow signal is preserved.   Sinuses/Orbits: Paranasal sinuses are aerated. Orbits are unremarkable.   Other: Sella is unremarkable.  Mastoid air cells are clear.   IMPRESSION: No evidence of recent infarction, hemorrhage, or mass.     Electronically Signed   By: Guadlupe Spanish M.D.   On: 06/17/2020 16:44   No other complaints or medical concerns today.  HPI   Prior to Admission medications   Medication Sig Start Date End Date Taking? Authorizing Provider  buPROPion (WELLBUTRIN SR) 150 MG 12 hr tablet Take 1 tablet (150 mg total) by mouth daily. Patient not taking: Reported on 06/17/2020 09/19/19 12/18/19  Georgina Quint, MD  meclizine (ANTIVERT) 25 MG tablet Take 1-2 tablets (25-50 mg total) by mouth 3 (three) times daily as needed for dizziness. 06/17/20   Cristina Gong, PA-C  rosuvastatin (CRESTOR) 20 MG tablet Take 1 tablet (20 mg total) by mouth daily. Patient not taking: Reported on 06/17/2020 09/19/19   Georgina Quint, MD    No Known Allergies  Patient  Active Problem List   Diagnosis Date Noted  . Dyslipidemia 08/19/2020  . Thoracic aortic aneurysm without rupture (HCC) 06/17/2020  . Chronic obstructive pulmonary disease (HCC) 02/05/2020    History reviewed. No pertinent past medical history.  History reviewed. No pertinent surgical history.  Social History   Socioeconomic History  . Marital status: Married    Spouse name: Not on file  . Number of children: Not on file  . Years of  education: Not on file  . Highest education level: Not on file  Occupational History  . Not on file  Tobacco Use  . Smoking status: Former Smoker    Packs/day: 1.00    Types: Cigarettes  . Smokeless tobacco: Never Used  . Tobacco comment: 6 months ago stopped smoking.    Substance and Sexual Activity  . Alcohol use: Not Currently  . Drug use: Never  . Sexual activity: Yes  Other Topics Concern  . Not on file  Social History Narrative  . Not on file   Social Determinants of Health   Financial Resource Strain: Not on file  Food Insecurity: Not on file  Transportation Needs: Not on file  Physical Activity: Not on file  Stress: Not on file  Social Connections: Not on file  Intimate Partner Violence: Not on file    History reviewed. No pertinent family history.   Review of Systems  Constitutional: Negative.  Negative for chills and fever.  HENT: Negative.  Negative for congestion and sore throat.   Respiratory: Positive for cough and sputum production (Clear mucus mostly in the morning). Negative for hemoptysis.   Cardiovascular: Negative.  Negative for chest pain and palpitations.  Gastrointestinal: Negative for abdominal pain, nausea and vomiting.  Genitourinary: Negative.  Negative for dysuria and urgency.       Urinary dribbling  Skin: Negative.  Negative for rash.  Neurological: Negative.  Negative for dizziness and headaches.  Psychiatric/Behavioral: The patient has insomnia.   All other systems reviewed and are negative.    Today's Vitals   08/19/20 0825  BP: (!) 130/100  Pulse: 85  Temp: 98.5 F (36.9 C)  TempSrc: Oral  SpO2: 95%  Weight: 233 lb 6.4 oz (105.9 kg)  Height: 6' (1.829 m)   Body mass index is 31.65 kg/m. Wt Readings from Last 3 Encounters:  08/19/20 233 lb 6.4 oz (105.9 kg)  06/17/20 230 lb (104.3 kg)  05/26/20 237 lb (107.5 kg)     Physical Exam Vitals reviewed.  Constitutional:      Appearance: Normal appearance.  HENT:      Head: Normocephalic.  Eyes:     Extraocular Movements: Extraocular movements intact.     Pupils: Pupils are equal, round, and reactive to light.  Cardiovascular:     Rate and Rhythm: Normal rate and regular rhythm.     Pulses: Normal pulses.     Heart sounds: Normal heart sounds.  Pulmonary:     Effort: Pulmonary effort is normal.     Breath sounds: Normal breath sounds.  Abdominal:     Palpations: Abdomen is soft.     Tenderness: There is no abdominal tenderness.  Musculoskeletal:     Cervical back: Normal range of motion and neck supple.  Skin:    General: Skin is warm and dry.     Capillary Refill: Capillary refill takes less than 2 seconds.  Neurological:     General: No focal deficit present.     Mental Status: He is alert and oriented  to person, place, and time.  Psychiatric:        Mood and Affect: Mood normal.        Behavior: Behavior normal.      ASSESSMENT & PLAN: A total of 30 minutes was spent with the patient and counseling/coordination of care regarding COPD and management, dyslipidemia and cardiovascular risks associated with this condition, need to start cholesterol medication rosuvastatin 20 mg daily, education on nutrition, insomnia treatment with melatonin 10 mg nightly, need for urology evaluation of lower urinary tract symptoms, need for colon cancer screening with colonoscopy, review of most recent emergency department visit notes, review of most recent diagnostic imaging tests, review of most recent office visit notes with me, need for monitoring of thoracic aorta aneurysm, prognosis, documentation and need for follow-up.  Chronic obstructive pulmonary disease (HCC) Mostly chronic bronchitis with excessive mucus production.  May benefit from Mucinex daily as needed.  Functional capacity still pretty good.  No longer smoking.  Dyslipidemia Abnormal recent lipid profile.  Needs to start rosuvastatin 20 mg daily.  Diet and nutrition discussed.  Dribbling  following urination Lower urinary tract symptoms.  Needs urology evaluation.  Thoracic aortic aneurysm without rupture (HCC) Stable at 3.5 cm per last CT of chest.  Needs regular monitoring.  Socrates was seen today for follow-up and insomnia.  Diagnoses and all orders for this visit:  Simple chronic bronchitis (HCC) -     guaiFENesin (MUCINEX) 600 MG 12 hr tablet; Take 2 tablets (1,200 mg total) by mouth 2 (two) times daily as needed for to loosen phlegm.  Dyslipidemia -     rosuvastatin (CRESTOR) 20 MG tablet; Take 1 tablet (20 mg total) by mouth daily.  Colon cancer screening -     Ambulatory referral to Gastroenterology  Dribbling following urination -     Ambulatory referral to Urology  Thoracic aortic aneurysm without rupture [facility])    Patient Instructions   [email or web address]">  Bronquitis crnica en los adultos Chronic Bronchitis, Adult  La bronquitis crnica es una inflamacin de las vas respiratorias grandes (conductos bronquiales) que transportan el aire a los pulmones. La inflamacin provoca que se acumule ms mucosidad (esputo). La inflamacin y la mucosidad dificultan la respiracin. Esta afeccin es un tipo de enfermedad pulmonar obstructiva crnica (EPOC). La bronquitis crnica es una afeccin de largo plazo (crnica). Se define como tos crnica con produccin de esputo:  Durante al menos 3 meses del ao.  Durante 2 aos seguidos. Las personas con bronquitis crnica tienen ms probabilidades de Warehouse manager resfros y otras infecciones en la nariz, la garganta o las vas respiratorias. Cules son las causas? Las causas de esta afeccin son, con Chief Financial Officer, las siguientes:  Antecedentes de tabaquismo.  Exposicin al humo ambiental de tabaco o a una zona con humo durante un perodo de [employer].  Problemas pulmonares, como enfisema, asma, bronquiectasia o fibrosis qustica.  Exposicin prolongada a ciertos vapores o  sustancias qumicas que irritan los pulmones.  Infeccin. Cules son los signos o sntomas? Los sntomas de la bronquitis crnica pueden incluir:  Tos que provoca mucosidad (tos productiva).  Falta de aire.  Emitir sonidos sibilantes al respirar (sibilancias).  Opresin en el pecho.  Resfro o infecciones respiratorias que desaparecen y regresan. Cmo se diagnostica? Esta afeccin se puede diagnosticar en funcin de lo siguiente:  Los sntomas y los antecedentes mdicos.  Un examen fsico.  Pruebas, por ejemplo: ? Anlisis de [employer] de esputo. ? Anlisis de [place]. ? Radiografa de trax. ? Pruebas  del funcionamiento de los pulmones (pulmonar). Cmo se trata? La bronquitis crnica no tiene [place]. El tratamiento puede ayudar a Chief Operating Officer los sntomas. Esto puede comprender lo siguiente:  Beber lquidos. Esto puede ayudar a diluir la mucosidad de modo que sea ms fcil expectorarla.  Tcnicas para despejar la mucosidad.  Tomar medicamentos recetados por el mdico, por ejemplo: ? Medicamentos inhalados para mejorar el flujo de aire que entra y sale de los pulmones. ? Antibiticos para tratar o prevenir las infecciones pulmonares bacterianas.  Usar oxigenoterapia, si el nivel de oxgeno en la sangre es [place].  Rehabilitacin pulmonar. Es un programa que ayuda a aprender a Aeronautical engineer. El programa puede incluir ejercicios, [place], [place], tratamiento y apoyo. Siga estas instrucciones en su casa: Medicamentos  Use los medicamentos de venta libre y los recetados solamente como se lo haya indicado el mdico.  Si le recetaron un antibitico, tmelo como se lo haya indicado el mdico. No deje de tomar el antibitico aunque comience a sentirse mejor. Estilo de vida  No consuma ningn producto que contenga nicotina o tabaco, como cigarrillos, cigarrillos electrnicos y tabaco de Theatre manager. Si necesita ayuda para dejar de fumar, consulte al  mdico.  Mantngase lejos del humo de otras personas (humo ambiental de tabaco) y de cualquier irritante que le provoque ms tos, como los vapores de productos qumicos.  Siga una dieta saludable y haga ejercicio con regularidad. Hable con el mdico acerca de cules actividades son seguras para usted.   Prevencin de infecciones  Mantngase al da con todas las vacunas, incluso la vacuna contra la neumona y la vacuna contra la gripe.  Lvese las manos frecuentemente con agua y jabn durante al menos 20segundos. Use desinfectante para manos si no dispone de [place] y [place].  Evite el contacto con personas que tengan sntomas de resfro o gripe. Instrucciones generales  Beba suficiente lquido para mantener la orina de color amarillo plido.  Use oxigenoterapia en su hogar segn las indicaciones. ? Siga las indicaciones del mdico acerca de cmo usar oxgeno de manera segura y tome medidas para evitar incendios. ? No fume mientras [place] oxgeno ni permita que otras personas fumen en su casa.  Concurra a todas las visitas de [address] se lo haya indicado el mdico. Esto es importante.   Comunquese con un mdico si:  La falta de aire o la tos empeoran incluso cuando toma medicamentos.  La mucosidad se vuelve ms espesa o cambia de color.  No puede expulsar la mucosidad al toser.  Tiene fiebre. Solicite ayuda de inmediato si:  Tiene dificultad para respirar.  Siente dolor en el pecho.  Se siente mareado o confundido. Estos sntomas pueden representar un problema grave que constituye Radio broadcast assistant. No espere a ver si los sntomas desaparecen. Solicite atencin mdica de inmediato. Comunquese con el servicio de emergencias de su localidad (911 en los [place]). No conduzca por sus propios medios [employer]. Resumen  La bronquitis crnica es una inflamacin de las vas respiratorias grandes (conductos bronquiales) que transportan el aire a los pulmones. La  inflamacin provoca que se acumule mucosidad (esputo). La inflamacin y la mucosidad dificultan la respiracin.  Si le recetaron un antibitico, tmelo como se lo haya indicado el mdico. No deje de tomar el antibitico aunque comience a sentirse mejor.  Beba suficiente lquido para Photographer orina de color amarillo plido. Beber lquidos puede ayudar a diluir la mucosidad de modo que sea ms fcil expectorarla.  No consuma ningn  producto que contenga nicotina o tabaco, como cigarrillos, cigarrillos electrnicos y tabaco de Theatre manager. Si necesita ayuda para dejar de fumar, consulte al mdico. Esta informacin no tiene Theme park manager el consejo del mdico. Asegrese de hacerle al mdico cualquier pregunta que tenga. Document Revised: 06/23/2019 Document Reviewed: 06/23/2019 Elsevier Patient Education  2021 Elsevier Inc.     Edwina Barth, MD [facility]

## 2020-08-19 NOTE — Assessment & Plan Note (Signed)
Stable at 3.5 cm per last CT of chest.  Needs regular monitoring.

## 2020-08-19 NOTE — Assessment & Plan Note (Signed)
Lower urinary tract symptoms.  Needs urology evaluation.

## 2020-12-21 ENCOUNTER — Encounter: Payer: Self-pay | Admitting: Emergency Medicine

## 2021-01-10 ENCOUNTER — Ambulatory Visit: Payer: BC Managed Care – PPO | Admitting: Emergency Medicine

## 2021-01-10 ENCOUNTER — Other Ambulatory Visit: Payer: Self-pay

## 2021-01-10 ENCOUNTER — Encounter: Payer: Self-pay | Admitting: Emergency Medicine

## 2021-01-10 VITALS — BP 138/70 | HR 80 | Temp 98.3°F | Wt 243.0 lb

## 2021-01-10 DIAGNOSIS — Z23 Encounter for immunization: Secondary | ICD-10-CM | POA: Diagnosis not present

## 2021-01-10 DIAGNOSIS — G4700 Insomnia, unspecified: Secondary | ICD-10-CM | POA: Diagnosis not present

## 2021-01-10 DIAGNOSIS — F418 Other specified anxiety disorders: Secondary | ICD-10-CM | POA: Diagnosis not present

## 2021-01-10 DIAGNOSIS — F419 Anxiety disorder, unspecified: Secondary | ICD-10-CM | POA: Insufficient documentation

## 2021-01-10 DIAGNOSIS — N3943 Post-void dribbling: Secondary | ICD-10-CM | POA: Diagnosis not present

## 2021-01-10 DIAGNOSIS — K625 Hemorrhage of anus and rectum: Secondary | ICD-10-CM

## 2021-01-10 MED ORDER — TRAZODONE HCL 50 MG PO TABS: 25.0000 mg | ORAL_TABLET | Freq: Every evening | ORAL | 3 refills | Status: DC | PRN

## 2021-01-10 NOTE — Assessment & Plan Note (Signed)
Needs urological evaluation.  Referral placed today. Has no hematuria or nocturia.  No urgency or frequency.

## 2021-01-10 NOTE — Progress Notes (Addendum)
Scott Hanna 55 y.o.   Chief Complaint  Patient presents with   sleeping concerns    Pt wakes up at night and cn not go back to sleep/ x 53mon    HISTORY OF PRESENT ILLNESS: This is a 55 y.o. male complaining of trouble sleeping since he started new job about 2 months ago. Working hours 6 AM to 6 PM.  Does not like his job and is creating a lot of anxiety. Also complaining of post void dribbling.  Was referred to urologist in the past but canceled appointment.  Needs to reschedule again. Also complaining of intermittent blood in the stools.  Needs colonoscopy referral. Also complaining of intermittent muscle spasms to all extremities. No other complaints or medical concerns today.  HPI   Prior to Admission medications   Medication Sig Start Date End Date Taking? Authorizing Provider  rosuvastatin (CRESTOR) 20 MG tablet Take 1 tablet (20 mg total) by mouth daily. 08/19/20  Yes Georgina Quint, MD  buPROPion [facility] SR) 150 MG 12 hr tablet Take 1 tablet (150 mg total) by mouth daily. Patient not taking: Reported on 06/17/2020 09/19/19 12/18/19  Georgina Quint, MD  guaiFENesin (MUCINEX) 600 MG 12 hr tablet Take 2 tablets (1,200 mg total) by mouth 2 (two) times daily as needed for to loosen phlegm. Patient not taking: Reported on 01/10/2021 08/19/20   Georgina Quint, MD  meclizine (ANTIVERT) 25 MG tablet Take 1-2 tablets (25-50 mg total) by mouth 3 (three) times daily as needed for dizziness. Patient not taking: No sig reported 06/17/20   Cristina Gong, PA-C    No Known Allergies  Patient Active Problem List   Diagnosis Date Noted   Dyslipidemia 08/19/2020   Dribbling following urination 08/19/2020   Thoracic aortic aneurysm without rupture 06/17/2020   Chronic obstructive pulmonary disease (HCC) 02/05/2020    No past medical history on file.  No past surgical history on file.  Social History   Socioeconomic History   Marital status: Married     Spouse name: Not on file   Number of children: Not on file   Years of education: Not on file   Highest education level: Not on file  Occupational History   Not on file  Tobacco Use   Smoking status: Former    Packs/day: 1.00    Types: Cigarettes   Smokeless tobacco: Never   Tobacco comments:    6 months ago stopped smoking.    Substance and Sexual Activity   Alcohol use: Not Currently   Drug use: Never   Sexual activity: Yes  Other Topics Concern   Not on file  Social History Narrative   Not on file   Social Determinants of Health   Financial Resource Strain: Not on file  Food Insecurity: Not on file  Transportation Needs: Not on file  Physical Activity: Not on file  Stress: Not on file  Social Connections: Not on file  Intimate Partner Violence: Not on file    No family history on file.   Review of Systems  Constitutional: Negative.  Negative for chills and fever.  HENT: Negative.  Negative for congestion and sore throat.   Respiratory: Negative.  Negative for cough and shortness of breath.   Cardiovascular: Negative.  Negative for chest pain and palpitations.  Gastrointestinal:  Positive for blood in stool. Negative for abdominal pain, melena, nausea and vomiting.  Genitourinary: Negative.  Negative for dysuria and hematuria.  Musculoskeletal: Negative.   Skin: Negative.  Negative for rash.  Neurological:  Negative for dizziness and headaches.  All other systems reviewed and are negative.  Vitals:   01/10/21 1511  BP: 138/70  Pulse: 80  Temp: 98.3 F (36.8 C)  SpO2: 99%   Wt Readings from Last 3 Encounters:  01/10/21 243 lb (110.2 kg)  08/19/20 233 lb 6.4 oz (105.9 kg)  06/17/20 230 lb (104.3 kg)    Physical Exam Vitals reviewed.  Constitutional:      Appearance: Normal appearance.  HENT:     Head: Normocephalic.  Eyes:     Extraocular Movements: Extraocular movements intact.  Cardiovascular:     Rate and Rhythm: Normal rate.  Pulmonary:      Effort: Pulmonary effort is normal.  Musculoskeletal:        General: Normal range of motion.     Cervical back: Normal range of motion.  Skin:    General: Skin is warm and dry.  Neurological:     General: No focal deficit present.     Mental Status: He is alert and oriented to person, place, and time.  Psychiatric:        Behavior: Behavior normal.     ASSESSMENT & PLAN: Problem List Items Addressed This Visit       Digestive   Rectal bleeding    Stable vital signs.  Intermittent bleeding.  Benign examination. Needs referral for GI for colonoscopy.      Relevant Orders   Ambulatory referral to Gastroenterology     Other   Post-void dribbling    Needs urological evaluation.  Referral placed today. Has no hematuria or nocturia.  No urgency or frequency.      Relevant Orders   Ambulatory referral to Urology   Insomnia - Primary    Related to situational anxiety.  We will try trazodone 50 mg at bedtime.      Relevant Medications   traZODone (DESYREL) 50 MG tablet   Situational anxiety    Work related and affecting quality of life tremendously.  Patient very well aware of triggers.  Advise on how to cope with anxiety given.      Relevant Medications   traZODone (DESYREL) 50 MG tablet   Other Visit Diagnoses     Need for influenza vaccination       Relevant Orders   Flu Vaccine QUAD 43mo+IM (Fluarix, Fluzone & Alfiuria Quad PF) (Completed)      Patient Instructions  Insomnio Insomnia El insomnio es un trastorno del sueo que causa dificultades para conciliar el sueo o para [place]. Puede producir fatiga, falta de energa, dificultad para concentrarse, cambios en el estado de nimo y mal rendimiento escolar o laboral. Hay tres formas diferentes de clasificar el insomnio: Dificultad para conciliar el sueo. Dificultad para mantener el sueo. Despertar muy precoz por la maana. Cualquier tipo de insomnio puede ser a Air cabin crew (crnico) o a Product manager  (agudo). Ambos son frecuentes. Generalmente, el insomnio a corto plazo dura tres meses o menos tiempo. El crnico ocurre al menos tres veces por semana durante ms de tres meses. Cules son las causas? El insomnio puede deberse a otra afeccin, situacin o sustancia, por ejemplo: Ansiedad. Ciertos medicamentos. Enfermedad de reflujo gastroesofgico (ERGE) u otras enfermedades gastrointestinales. Asma y otras enfermedades respiratorias. Sndrome de las piernas inquietas, apnea del sueo u otros trastornos del sueo. Dolor crnico. Menopausia. Accidente cerebrovascular. Consumo excesivo de alcohol, tabaco u drogas ilegales. Afecciones de salud mental, como depresin. Cafena. Trastornos neurolgicos, como enfermedad de  Alzheimer. Hiperactividad de la glndula tiroidea (hipertiroidismo). En ocasiones, la causa del insomnio puede ser desconocida. Qu incrementa el riesgo? Los factores de riesgo de tener insomnio incluyen lo siguiente: Sexo. La enfermedad afecta ms a menudo a las mujeres que a los hombres. La edad. El insomnio es ms frecuente a medida que una persona envejece. Estrs. Falta de actividad fsica. Los horarios de trabajo irregulares o los turnos nocturnos. Los viajes a lugares de diferentes zonas horarias. Ciertas afecciones mdicas y de salud mental. Cules son los signos o sntomas? Si tiene insomnio, el sntoma principal es la dificultad para conciliar el sueo o mantenerlo. Esto puede derivar en otros sntomas, por ejemplo: Sentirse fatigado o tener poca energa. Ponerse nervioso por [employer] irse a dormir. No sentirse descansado por la maana. Tener dificultad para concentrarse. Sentirse irritable, ansioso o deprimido. Cmo se diagnostica? Esta afeccin se puede diagnosticar en funcin de lo siguiente: Los sntomas y los antecedentes mdicos. El mdico puede hacerle preguntas sobre: Hbitos de sueo. Cualquier afeccin mdica que tenga. La salud mental. Un  examen fsico. Cmo se trata? El tratamiento para el insomnio depende de la causa. El tratamiento puede centrarse en tratar Neomia Dear afeccin preexistente que causa el insomnio. El tratamiento tambin puede incluir lo siguiente: Medicamentos que lo ayuden a dormir. Asesoramiento psicolgico o terapia. Ajustes en el estilo de vida para ayudarlo a dormir mejor. Siga estas instrucciones en su casa: Comida y bebida  Limite o evite el consumo de alcohol, bebidas con cafena y cigarrillos, especialmente cerca de la hora de [place], ya que pueden perturbarle el sueo. No consuma una comida suculenta ni coma alimentos condimentados justo antes de la hora de [place]. Esto puede causarle molestias digestivas y dificultades para dormir. Hbitos de sueo  Lleve un registro del sueo ya que podra ser de utilidad para que usted y a su mdico puedan determinar qu podra estar causndole insomnio. Escriba los siguientes datos: Cundo duerme. Cundo se despierta durante la noche. Qu tan bien duerme. Qu tan relajado se siente al da siguiente. Cualquier efecto secundario de los medicamentos que toma. Lo que usted come y bebe. Convierta su habitacin en un lugar oscuro, cmodo donde sea fcil conciliar el sueo. Coloque persianas o cortinas oscuras que impidan la entrada de la luz del exterior. Para bloquear los ruidos, use un aparato que reproduzca sonidos ambientales o relajantes de fondo. Mantenga baja la temperatura. Limite el uso de pantallas antes de la hora de [place]. Esto incluye: Watching TV. Usar el telfono inteligente, la tableta o la computadora. Siga una rutina que incluya ir a dormir y Chiropodist a la misma hora cada da y noche. Esto puede ayudarlo a conciliar el sueo ms rpidamente. Considere realizar [employer] tranquila, como leer, e incorporarla como parte de la rutina a la hora de irse a dormir. Trate de evitar tomar siestas durante el da para que pueda dormir mejor por la  noche. Levntese de la cama si sigue despierto despus de 15 minutos de haber intentado dormirse. Mantenga bajas las luces, pero intente leer o hacer una actividad tranquila. Cuando tenga sueo, regrese a Pharmacist, hospital. Instrucciones generales Use los medicamentos de venta libre y los recetados solamente como se lo haya indicado el mdico. Realice ejercicio con regularidad como se lo haya indicado el mdico. Evite la actividad fsica desde varias horas antes de irse a dormir. Utilice tcnicas de relajacin para controlar el estrs. Pdale al mdico que le sugiera algunas tcnicas que sean adecuadas para usted. Pueden incluir: Ejercicios  de respiracin. Rutinas para aliviar la tensin muscular. Visualizacin de escenas apacibles. Conduzca con cuidado. No conduzca si est muy somnoliento. Concurra a todas las visitas de [address] se lo haya indicado el mdico. Esto es importante. Comunquese con un mdico si: Est cansado durante todo Medical laboratory scientific officer. Tiene dificultad en su rutina diaria debido a la somnolencia. Sigue teniendo problemas para dormir o Teacher, English as a foreign language. Solicite ayuda de inmediato si: Piensa seriamente en lastimarse a usted mismo o a Engineer, maintenance (IT). Si alguna vez siente que puede lastimarse o Physicist, medical a Economist, o tiene pensamientos de poner fin a su vida, busque ayuda de inmediato. Puede dirigirse al servicio de emergencias ms cercano o comunicarse con: Servicio de emergencias de su localidad (911 en EE. UU.). Una lnea de asistencia al suicida y atencin en crisis como National Suicide Prevention Lifeline (Lnea Nacional de Prevencin del Suicidio), al (973) 022-7139. Est disponible las 24 horas del da. Resumen El insomnio es un trastorno del sueo que causa dificultades para conciliar el sueo o para [place]. El insomnio puede ser a Air cabin crew (crnico) o a Product manager (agudo). El tratamiento para el insomnio depende de la causa. El tratamiento puede centrarse en tratar Neomia Dear  afeccin preexistente que causa el insomnio. Lleve un registro del sueo ya que podra ser de utilidad para que usted y a su mdico puedan determinar qu podra estar causndole insomnio. Esta informacin no tiene Theme park manager el consejo del mdico. Asegrese de hacerle al mdico cualquier pregunta que tenga. Document Revised: 01/19/2020 Document Reviewed: 01/19/2020 Elsevier Patient Education  2022 Elsevier Inc.    Edwina Barth, MD [facility]

## 2021-01-10 NOTE — Patient Instructions (Signed)
Insomnio °Insomnia °El insomnio es un trastorno del sueño que causa dificultades para conciliar el sueño o para mantenerlo. Puede producir fatiga, falta de energía, dificultad para concentrarse, cambios en el estado de ánimo y mal rendimiento escolar o laboral. °Hay tres formas diferentes de clasificar el insomnio: °Dificultad para conciliar el sueño. °Dificultad para mantener el sueño. °Despertar muy precoz por la mañana. °Cualquier tipo de insomnio puede ser a largo plazo (crónico) o a corto plazo (agudo). Ambos son frecuentes. Generalmente, el insomnio a corto plazo dura tres meses o menos tiempo. El crónico ocurre al menos tres veces por semana durante más de tres meses. °¿Cuáles son las causas? °El insomnio puede deberse a otra afección, situación o sustancia, por ejemplo: °Ansiedad. °Ciertos medicamentos. °Enfermedad de reflujo gastroesofágico (ERGE) u otras enfermedades gastrointestinales. °Asma y otras enfermedades respiratorias. °Síndrome de las piernas inquietas, apnea del sueño u otros trastornos del sueño. °Dolor crónico. °Menopausia. °Accidente cerebrovascular. °Consumo excesivo de alcohol, tabaco u drogas ilegales. °Afecciones de salud mental, como depresión. °Cafeína. °Trastornos neurológicos, como enfermedad de Alzheimer. °Hiperactividad de la glándula tiroidea (hipertiroidismo). °En ocasiones, la causa del insomnio puede ser desconocida. °¿Qué incrementa el riesgo? °Los factores de riesgo de tener insomnio incluyen lo siguiente: °Sexo. La enfermedad afecta más a menudo a las mujeres que a los hombres. °La edad. El insomnio es más frecuente a medida que una persona envejece. °Estrés. °Falta de actividad física. °Los horarios de trabajo irregulares o los turnos nocturnos. °Los viajes a lugares de diferentes zonas horarias. °Ciertas afecciones médicas y de salud mental. °¿Cuáles son los signos o síntomas? °Si tiene insomnio, el síntoma principal es la dificultad para conciliar el sueño o mantenerlo.  Esto puede derivar en otros síntomas, por ejemplo: °Sentirse fatigado o tener poca energía. °Ponerse nervioso por tener que irse a dormir. °No sentirse descansado por la mañana. °Tener dificultad para concentrarse. °Sentirse irritable, ansioso o deprimido. °¿Cómo se diagnostica? °Esta afección se puede diagnosticar en función de lo siguiente: °Los síntomas y los antecedentes médicos. El médico puede hacerle preguntas sobre: °Hábitos de sueño. °Cualquier afección médica que tenga. °La salud mental. °Un examen físico. °¿Cómo se trata? °El tratamiento para el insomnio depende de la causa. El tratamiento puede centrarse en tratar una afección preexistente que causa el insomnio. El tratamiento también puede incluir lo siguiente: °Medicamentos que lo ayuden a dormir. °Asesoramiento psicológico o terapia. °Ajustes en el estilo de vida para ayudarlo a dormir mejor. °Siga estas instrucciones en su casa: °Comida y bebida ° °Limite o evite el consumo de alcohol, bebidas con cafeína y cigarrillos, especialmente cerca de la hora de acostarse, ya que pueden perturbarle el sueño. °No consuma una comida suculenta ni coma alimentos condimentados justo antes de la hora de acostarse. Esto puede causarle molestias digestivas y dificultades para dormir. °Hábitos de sueño ° °Lleve un registro del sueño ya que podría ser de utilidad para que usted y a su médico puedan determinar qué podría estar causándole insomnio. Escriba los siguientes datos: °Cuándo duerme. °Cuándo se despierta durante la noche. °Qué tan bien duerme. °Qué tan relajado se siente al día siguiente. °Cualquier efecto secundario de los medicamentos que toma. °Lo que usted come y bebe. °Convierta su habitación en un lugar oscuro, cómodo donde sea fácil conciliar el sueño. °Coloque persianas o cortinas oscuras que impidan la entrada de la luz del exterior. °Para bloquear los ruidos, use un aparato que reproduzca sonidos ambientales o relajantes de fondo. °Mantenga baja la  temperatura. °Limite el uso de pantallas antes de la hora   de acostarse. Esto incluye: °Watching TV. °Usar el teléfono inteligente, la tableta o la computadora. °Siga una rutina que incluya ir a dormir y despertarse a la misma hora cada día y noche. Esto puede ayudarlo a conciliar el sueño más rápidamente. Considere realizar una actividad tranquila, como leer, e incorporarla como parte de la rutina a la hora de irse a dormir. °Trate de evitar tomar siestas durante el día para que pueda dormir mejor por la noche. °Levántese de la cama si sigue despierto después de 15 minutos de haber intentado dormirse. Mantenga bajas las luces, pero intente leer o hacer una actividad tranquila. Cuando tenga sueño, regrese a la cama. °Instrucciones generales °Use los medicamentos de venta libre y los recetados solamente como se lo haya indicado el médico. °Realice ejercicio con regularidad como se lo haya indicado el médico. Evite la actividad física desde varias horas antes de irse a dormir. °Utilice técnicas de relajación para controlar el estrés. Pídale al médico que le sugiera algunas técnicas que sean adecuadas para usted. Pueden incluir: °Ejercicios de respiración. °Rutinas para aliviar la tensión muscular. °Visualización de escenas apacibles. °Conduzca con cuidado. No conduzca si está muy somnoliento. °Concurra a todas las visitas de seguimiento como se lo haya indicado el médico. Esto es importante. °Comuníquese con un médico si: °Está cansado durante todo el día. °Tiene dificultad en su rutina diaria debido a la somnolencia. °Sigue teniendo problemas para dormir o estos empeoran. °Solicite ayuda de inmediato si: °Piensa seriamente en lastimarse a usted mismo o a otra persona. °Si alguna vez siente que puede lastimarse o lastimar a otras personas, o tiene pensamientos de poner fin a su vida, busque ayuda de inmediato. Puede dirigirse al servicio de emergencias más cercano o comunicarse con: °Servicio de emergencias de su  localidad (911 en EE. UU.). °Una línea de asistencia al suicida y atención en crisis como National Suicide Prevention Lifeline (Línea Nacional de Prevención del Suicidio), al 1-800-273-8255. Está disponible las 24 horas del día. °Resumen °El insomnio es un trastorno del sueño que causa dificultades para conciliar el sueño o para mantenerlo. °El insomnio puede ser a largo plazo (crónico) o a corto plazo (agudo). °El tratamiento para el insomnio depende de la causa. El tratamiento puede centrarse en tratar una afección preexistente que causa el insomnio. °Lleve un registro del sueño ya que podría ser de utilidad para que usted y a su médico puedan determinar qué podría estar causándole insomnio. °Esta información no tiene como fin reemplazar el consejo del médico. Asegúrese de hacerle al médico cualquier pregunta que tenga. °Document Revised: 01/19/2020 Document Reviewed: 01/19/2020 °Elsevier Patient Education © 2022 Elsevier Inc. ° °

## 2021-01-10 NOTE — Assessment & Plan Note (Signed)
Work related and affecting quality of life tremendously.  Patient very well aware of triggers.  Advise on how to cope with anxiety given.

## 2021-01-10 NOTE — Assessment & Plan Note (Signed)
Stable vital signs.  Intermittent bleeding.  Benign examination. Needs referral for GI for colonoscopy.

## 2021-01-10 NOTE — Assessment & Plan Note (Signed)
Related to situational anxiety.  We will try trazodone 50 mg at bedtime.

## 2021-02-24 DIAGNOSIS — Z20822 Contact with and (suspected) exposure to covid-19: Secondary | ICD-10-CM | POA: Diagnosis not present

## 2021-02-24 DIAGNOSIS — Z03818 Encounter for observation for suspected exposure to other biological agents ruled out: Secondary | ICD-10-CM | POA: Diagnosis not present

## 2021-04-26 ENCOUNTER — Encounter: Payer: Self-pay | Admitting: Emergency Medicine

## 2021-05-09 ENCOUNTER — Ambulatory Visit (HOSPITAL_COMMUNITY)
Admission: EM | Admit: 2021-05-09 | Discharge: 2021-05-09 | Disposition: A | Discharge status: Home / Self Care | Payer: BC Managed Care – PPO | Source: Home / Self Care | Attending: Nurse Practitioner | Admitting: Nurse Practitioner

## 2021-05-09 ENCOUNTER — Encounter (HOSPITAL_COMMUNITY): Payer: Self-pay | Admitting: Emergency Medicine

## 2021-05-09 ENCOUNTER — Telehealth (HOSPITAL_COMMUNITY): Payer: Self-pay | Admitting: Nurse Practitioner

## 2021-05-09 ENCOUNTER — Encounter (HOSPITAL_COMMUNITY): Payer: Self-pay

## 2021-05-09 ENCOUNTER — Other Ambulatory Visit: Payer: Self-pay

## 2021-05-09 ENCOUNTER — Inpatient Hospital Stay (HOSPITAL_COMMUNITY)
Admission: EM | Admit: 2021-05-09 | Discharge: 2021-05-12 | DRG: 378 | Disposition: A | Discharge status: Home / Self Care | Payer: BC Managed Care – PPO | Attending: Internal Medicine | Admitting: Internal Medicine

## 2021-05-09 ENCOUNTER — Ambulatory Visit (INDEPENDENT_AMBULATORY_CARE_PROVIDER_SITE_OTHER): Payer: BC Managed Care – PPO

## 2021-05-09 DIAGNOSIS — I712 Thoracic aortic aneurysm, without rupture, unspecified: Secondary | ICD-10-CM | POA: Diagnosis present

## 2021-05-09 DIAGNOSIS — Z789 Other specified health status: Secondary | ICD-10-CM | POA: Diagnosis present

## 2021-05-09 DIAGNOSIS — D125 Benign neoplasm of sigmoid colon: Secondary | ICD-10-CM | POA: Diagnosis not present

## 2021-05-09 DIAGNOSIS — K635 Polyp of colon: Secondary | ICD-10-CM | POA: Diagnosis not present

## 2021-05-09 DIAGNOSIS — R000 Tachycardia, unspecified: Secondary | ICD-10-CM | POA: Diagnosis present

## 2021-05-09 DIAGNOSIS — G8929 Other chronic pain: Secondary | ICD-10-CM | POA: Diagnosis present

## 2021-05-09 DIAGNOSIS — F1090 Alcohol use, unspecified, uncomplicated: Secondary | ICD-10-CM | POA: Diagnosis present

## 2021-05-09 DIAGNOSIS — E861 Hypovolemia: Secondary | ICD-10-CM | POA: Diagnosis not present

## 2021-05-09 DIAGNOSIS — R5383 Other fatigue: Secondary | ICD-10-CM | POA: Insufficient documentation

## 2021-05-09 DIAGNOSIS — K259 Gastric ulcer, unspecified as acute or chronic, without hemorrhage or perforation: Secondary | ICD-10-CM | POA: Diagnosis not present

## 2021-05-09 DIAGNOSIS — B9681 Helicobacter pylori [H. pylori] as the cause of diseases classified elsewhere: Secondary | ICD-10-CM | POA: Diagnosis present

## 2021-05-09 DIAGNOSIS — Z79899 Other long term (current) drug therapy: Secondary | ICD-10-CM | POA: Diagnosis not present

## 2021-05-09 DIAGNOSIS — K264 Chronic or unspecified duodenal ulcer with hemorrhage: Secondary | ICD-10-CM | POA: Diagnosis present

## 2021-05-09 DIAGNOSIS — R7303 Prediabetes: Secondary | ICD-10-CM | POA: Diagnosis not present

## 2021-05-09 DIAGNOSIS — Z7982 Long term (current) use of aspirin: Secondary | ICD-10-CM

## 2021-05-09 DIAGNOSIS — K254 Chronic or unspecified gastric ulcer with hemorrhage: Secondary | ICD-10-CM | POA: Diagnosis not present

## 2021-05-09 DIAGNOSIS — K5731 Diverticulosis of large intestine without perforation or abscess with bleeding: Secondary | ICD-10-CM | POA: Diagnosis not present

## 2021-05-09 DIAGNOSIS — R739 Hyperglycemia, unspecified: Secondary | ICD-10-CM | POA: Diagnosis not present

## 2021-05-09 DIAGNOSIS — E871 Hypo-osmolality and hyponatremia: Secondary | ICD-10-CM | POA: Diagnosis not present

## 2021-05-09 DIAGNOSIS — D500 Iron deficiency anemia secondary to blood loss (chronic): Secondary | ICD-10-CM | POA: Diagnosis not present

## 2021-05-09 DIAGNOSIS — Z20822 Contact with and (suspected) exposure to covid-19: Secondary | ICD-10-CM | POA: Diagnosis not present

## 2021-05-09 DIAGNOSIS — Z8719 Personal history of other diseases of the digestive system: Secondary | ICD-10-CM | POA: Diagnosis not present

## 2021-05-09 DIAGNOSIS — R531 Weakness: Secondary | ICD-10-CM | POA: Insufficient documentation

## 2021-05-09 DIAGNOSIS — D122 Benign neoplasm of ascending colon: Secondary | ICD-10-CM | POA: Diagnosis not present

## 2021-05-09 DIAGNOSIS — K2951 Unspecified chronic gastritis with bleeding: Secondary | ICD-10-CM | POA: Diagnosis not present

## 2021-05-09 DIAGNOSIS — E785 Hyperlipidemia, unspecified: Secondary | ICD-10-CM | POA: Diagnosis present

## 2021-05-09 DIAGNOSIS — K640 First degree hemorrhoids: Secondary | ICD-10-CM | POA: Diagnosis not present

## 2021-05-09 DIAGNOSIS — D72829 Elevated white blood cell count, unspecified: Secondary | ICD-10-CM | POA: Diagnosis present

## 2021-05-09 DIAGNOSIS — D649 Anemia, unspecified: Secondary | ICD-10-CM

## 2021-05-09 DIAGNOSIS — N3289 Other specified disorders of bladder: Secondary | ICD-10-CM | POA: Diagnosis not present

## 2021-05-09 DIAGNOSIS — Z87891 Personal history of nicotine dependence: Secondary | ICD-10-CM

## 2021-05-09 DIAGNOSIS — F419 Anxiety disorder, unspecified: Secondary | ICD-10-CM | POA: Diagnosis not present

## 2021-05-09 DIAGNOSIS — K5730 Diverticulosis of large intestine without perforation or abscess without bleeding: Secondary | ICD-10-CM | POA: Diagnosis not present

## 2021-05-09 DIAGNOSIS — K274 Chronic or unspecified peptic ulcer, site unspecified, with hemorrhage: Secondary | ICD-10-CM | POA: Diagnosis not present

## 2021-05-09 DIAGNOSIS — J449 Chronic obstructive pulmonary disease, unspecified: Secondary | ICD-10-CM | POA: Diagnosis not present

## 2021-05-09 DIAGNOSIS — D62 Acute posthemorrhagic anemia: Secondary | ICD-10-CM | POA: Diagnosis not present

## 2021-05-09 DIAGNOSIS — K922 Gastrointestinal hemorrhage, unspecified: Secondary | ICD-10-CM | POA: Diagnosis present

## 2021-05-09 DIAGNOSIS — R0602 Shortness of breath: Secondary | ICD-10-CM | POA: Diagnosis not present

## 2021-05-09 DIAGNOSIS — A048 Other specified bacterial intestinal infections: Secondary | ICD-10-CM | POA: Diagnosis not present

## 2021-05-09 DIAGNOSIS — K269 Duodenal ulcer, unspecified as acute or chronic, without hemorrhage or perforation: Secondary | ICD-10-CM | POA: Diagnosis not present

## 2021-05-09 HISTORY — DX: Thoracic aortic aneurysm, without rupture, unspecified: I71.20

## 2021-05-09 HISTORY — DX: Gastrointestinal hemorrhage, unspecified: K92.2

## 2021-05-09 HISTORY — DX: Chronic obstructive pulmonary disease, unspecified: J44.9

## 2021-05-09 LAB — URINALYSIS, ROUTINE W REFLEX MICROSCOPIC
Bacteria, UA: NONE SEEN
Bilirubin Urine: NEGATIVE
Glucose, UA: NEGATIVE mg/dL
Ketones, ur: NEGATIVE mg/dL
Leukocytes,Ua: NEGATIVE
Nitrite: NEGATIVE
Protein, ur: NEGATIVE mg/dL
Specific Gravity, Urine: 1.019 (ref 1.005–1.030)
pH: 5 (ref 5.0–8.0)

## 2021-05-09 LAB — CBC
HCT: 20.9 % — ABNORMAL LOW (ref 39.0–52.0)
HCT: 19 % — ABNORMAL LOW (ref 39.0–52.0)
Hemoglobin: 6.3 g/dL — CL (ref 13.0–17.0)
Hemoglobin: 6 g/dL — CL (ref 13.0–17.0)
MCH: 27 pg (ref 26.0–34.0)
MCH: 28.8 pg (ref 26.0–34.0)
MCHC: 30.1 g/dL (ref 30.0–36.0)
MCHC: 31.6 g/dL (ref 30.0–36.0)
MCV: 89.7 fL (ref 80.0–100.0)
MCV: 91.3 fL (ref 80.0–100.0)
Platelets: 315 10*3/uL (ref 150–400)
Platelets: 290 10*3/uL (ref 150–400)
RBC: 2.33 MIL/uL — ABNORMAL LOW (ref 4.22–5.81)
RBC: 2.08 MIL/uL — ABNORMAL LOW (ref 4.22–5.81)
RDW: 14.9 % (ref 11.5–15.5)
RDW: 15.1 % (ref 11.5–15.5)
WBC: 17.9 10*3/uL — ABNORMAL HIGH (ref 4.0–10.5)
WBC: 17.1 10*3/uL — ABNORMAL HIGH (ref 4.0–10.5)
nRBC: 0.8 % — ABNORMAL HIGH (ref 0.0–0.2)
nRBC: 0.9 % — ABNORMAL HIGH (ref 0.0–0.2)

## 2021-05-09 LAB — COMPREHENSIVE METABOLIC PANEL
ALT: 46 U/L — ABNORMAL HIGH (ref 0–44)
AST: 27 U/L (ref 15–41)
Albumin: 3 g/dL — ABNORMAL LOW (ref 3.5–5.0)
Alkaline Phosphatase: 38 U/L (ref 38–126)
Anion gap: 6 (ref 5–15)
BUN: 15 mg/dL (ref 6–20)
CO2: 29 mmol/L (ref 22–32)
Calcium: 8.5 mg/dL — ABNORMAL LOW (ref 8.9–10.3)
Chloride: 104 mmol/L (ref 98–111)
Creatinine, Ser: 0.84 mg/dL (ref 0.61–1.24)
GFR, Estimated: >60 mL/min (ref >60)
Glucose, Bld: 142 mg/dL — ABNORMAL HIGH (ref 70–99)
Potassium: 4.2 mmol/L (ref 3.5–5.1)
Sodium: 139 mmol/L (ref 135–145)
Total Bilirubin: 0.2 mg/dL — ABNORMAL LOW (ref 0.3–1.2)
Total Protein: 5.7 g/dL — ABNORMAL LOW (ref 6.5–8.1)

## 2021-05-09 LAB — BASIC METABOLIC PANEL
Anion gap: 6 (ref 5–15)
BUN: 18 mg/dL (ref 6–20)
CO2: 25 mmol/L (ref 22–32)
Calcium: 8 mg/dL — ABNORMAL LOW (ref 8.9–10.3)
Chloride: 101 mmol/L (ref 98–111)
Creatinine, Ser: 0.9 mg/dL (ref 0.61–1.24)
GFR, Estimated: >60 mL/min (ref >60)
Glucose, Bld: 208 mg/dL — ABNORMAL HIGH (ref 70–99)
Potassium: 4.1 mmol/L (ref 3.5–5.1)
Sodium: 132 mmol/L — ABNORMAL LOW (ref 135–145)

## 2021-05-09 LAB — CBG MONITORING, ED: Glucose-Capillary: 262 mg/dL — ABNORMAL HIGH (ref 70–99)

## 2021-05-09 LAB — ABO/RH: ABO/RH(D): B POS

## 2021-05-09 LAB — RESP PANEL BY RT-PCR (FLU A&B, COVID) ARPGX2
Influenza A by PCR: NEGATIVE
Influenza B by PCR: NEGATIVE
SARS Coronavirus 2 by RT PCR: NEGATIVE

## 2021-05-09 LAB — POC OCCULT BLOOD, ED: Fecal Occult Bld: POSITIVE — AB

## 2021-05-09 LAB — PREPARE RBC (CROSSMATCH)

## 2021-05-09 LAB — TROPONIN I (HIGH SENSITIVITY): Troponin I (High Sensitivity): 5 ng/L (ref <18)

## 2021-05-09 MED ORDER — PANTOPRAZOLE INFUSION (NEW) - SIMPLE MED
8.0000 mg/h | INTRAVENOUS | Status: DC
  Administered 2021-05-09 – 2021-05-11 (×4): 8 mg/h via INTRAVENOUS
  Filled 2021-05-09 (×2): qty 80
  Filled 2021-05-09 (×2): qty 100
  Filled 2021-05-09 (×2): qty 80

## 2021-05-09 MED ORDER — PANTOPRAZOLE SODIUM 40 MG IV SOLR
40.0000 mg | Freq: Two times a day (BID) | INTRAVENOUS | Status: DC
Start: 2021-05-13 — End: 2021-05-11

## 2021-05-09 MED ORDER — PANTOPRAZOLE 80MG IVPB - SIMPLE MED
80.0000 mg | Freq: Once | INTRAVENOUS | Status: AC
Start: 2021-05-09 — End: 2021-05-09
  Administered 2021-05-09: 80 mg via INTRAVENOUS
  Filled 2021-05-09: qty 80

## 2021-05-09 MED ORDER — SODIUM CHLORIDE 0.9 % IV SOLN
10.0000 mL/h | Freq: Once | INTRAVENOUS | Status: AC
Start: 2021-05-09 — End: 2021-05-10
  Administered 2021-05-10: 10 mL/h via INTRAVENOUS

## 2021-05-09 NOTE — ED Triage Notes (Signed)
Patient arrives with wife stating patient has been having fatigue and weakness since last Saturday. Was told from UC hemoglobin of 6.3.

## 2021-05-09 NOTE — ED Provider Triage Note (Signed)
Emergency Medicine Provider Triage Evaluation Note  Scott Hanna , a 56 y.o. male  was evaluated in triage.  Pt complains of fatigue since last Saturday. Pt presented to urgent care and was found to have a hemoglobin of 6.3. He was referred to the ER for further evaluation.   Review of Systems  Positive: Fatigue, weakness Negative: CP, SOB  Physical Exam  BP 125/78    Pulse (!) 141    Temp (!) 97.4 F (36.3 C) (Oral)    Resp 16    Ht 6' (1.829 m)    Wt 108.9 kg    SpO2 100%    BMI 32.55 kg/m  Gen:   Awake, no distress    Resp:  Normal effort  MSK:   Moves extremities without difficulty  Other:    Medical Decision Making  Medically screening exam initiated at 7:02 PM.  Appropriate orders placed.  Scott Hanna was informed that the remainder of the evaluation will be completed by another provider, this initial triage assessment does not replace that evaluation, and the importance of remaining in the ED until their evaluation is complete.     Jeanella Flattery 05/09/21 1902

## 2021-05-09 NOTE — ED Provider Notes (Signed)
[facility] EMERGENCY DEPARTMENT Provider Note   CSN: [id number] Arrival date & time: 05/09/21  1836     History  Chief Complaint  Patient presents with   Fatigue   Abnormal Lab    Scott Hanna is a 56 y.o. male.  HPI     56 year old male with history of thoracic aortic aneurysm, COPD, presents with concern for 2 weeks of melena, and shortness of breath and fatigue for 3 days.  He was seen at the urgent care and referred here.  Presents with concern for fatigue, shortness of breath, loss of strength, for last few days  Black stool for about 2 weeks Burning in the chest with walking No abdominal pain, nausea or vomiting.  Haven't seen gastenterologist   Takes ibuprofen regularly, including ibuprofen today. Drinks alcohol frequently, mainly weekend,  2-3 4 oz rum daily, no withdrawal NO hx of cirrhosis known or other liver problems Also takes aspirin relatively regularly.  Spanish interpreter used   Past Medical History:  Diagnosis Date   COPD (chronic obstructive pulmonary disease) (HCC)    GI bleed    Thoracic aortic aneurysm without rupture    No past surgical history on file.  Home Medications Prior to Admission medications   Medication Sig Start Date End Date Taking? Authorizing Provider  acetaminophen (TYLENOL) 500 MG tablet Take 1,000 mg by mouth every 6 (six) hours as needed for moderate pain or headache.   Yes [provider]  aspirin (ASPIRIN 81) 81 MG EC tablet Take 81 mg by mouth every 3 (three) days. Swallow whole.   Yes [provider]  MELATONIN GUMMIES PO Take 20 mg by mouth at bedtime as needed (sleep).   Yes [provider]  rosuvastatin (CRESTOR) 20 MG tablet Take 1 tablet (20 mg total) by mouth daily. 08/19/20  Yes Sagardia, Eilleen Kempf, MD  traZODone (DESYREL) 50 MG tablet Take 0.5-1 tablets (25-50 mg total) by mouth at bedtime as needed for sleep. Patient taking differently: Take 50-75 mg  by mouth at bedtime. 01/10/21  Yes Georgina Quint, MD  buPROPion [facility] SR) 150 MG 12 hr tablet Take 1 tablet (150 mg total) by mouth daily. Patient not taking: Reported on 06/17/2020 09/19/19 12/18/19  Georgina Quint, MD  guaiFENesin (MUCINEX) 600 MG 12 hr tablet Take 2 tablets (1,200 mg total) by mouth 2 (two) times daily as needed for to loosen phlegm. Patient not taking: Reported on 01/10/2021 08/19/20   Georgina Quint, MD  meclizine (ANTIVERT) 25 MG tablet Take 1-2 tablets (25-50 mg total) by mouth 3 (three) times daily as needed for dizziness. Patient not taking: Reported on 08/19/2020 06/17/20   Cristina Gong, PA-C      Allergies    Patient has no known allergies.    Review of Systems   Review of Systems  Physical Exam Updated Vital Signs BP 112/78    Pulse (!) 116    Temp 99 F (37.2 C) (Oral)    Resp (!) 25    Ht 6' (1.829 m)    Wt 108.9 kg    SpO2 99%    BMI 32.55 kg/m  Physical Exam Vitals and nursing note reviewed.  Constitutional:      General: He is not in acute distress.    Appearance: He is well-developed. He is not diaphoretic.  HENT:     Head: Normocephalic and atraumatic.  Eyes:     Conjunctiva/sclera: Conjunctivae normal.  Cardiovascular:  Rate and Rhythm: Normal rate and regular rhythm.     Heart sounds: Normal heart sounds. No murmur heard.   No friction rub. No gallop.  Pulmonary:     Effort: Pulmonary effort is normal. No respiratory distress.     Breath sounds: Normal breath sounds. No wheezing or rales.  Abdominal:     General: There is no distension.     Palpations: Abdomen is soft.     Tenderness: There is no abdominal tenderness. There is no guarding.  Genitourinary:    Comments: melena Musculoskeletal:     Cervical back: Normal range of motion.  Skin:    General: Skin is warm and dry.  Neurological:     Mental Status: He is alert and oriented to person, place, and time.    ED Results / Procedures / Treatments    Labs (all labs ordered are listed, but only abnormal results are displayed) Labs Reviewed  BASIC METABOLIC PANEL - Abnormal; Notable for the following components:      Result Value   Sodium 132 (*)    Glucose, Bld 208 (*)    Calcium 8.0 (*)    All other components within normal limits  CBC - Abnormal; Notable for the following components:   WBC 17.1 (*)    RBC 2.08 (*)    Hemoglobin 6.0 (*)    HCT 19.0 (*)    nRBC 0.9 (*)    All other components within normal limits  URINALYSIS, ROUTINE W REFLEX MICROSCOPIC - Abnormal; Notable for the following components:   Hgb urine dipstick SMALL (*)    All other components within normal limits  CBG MONITORING, ED - Abnormal; Notable for the following components:   Glucose-Capillary 262 (*)    All other components within normal limits  POC OCCULT BLOOD, ED - Abnormal; Notable for the following components:   Fecal Occult Bld POSITIVE (*)    All other components within normal limits  RESP PANEL BY RT-PCR (FLU A&B, COVID) ARPGX2  POC OCCULT BLOOD, ED  TYPE AND SCREEN  ABO/RH  PREPARE RBC (CROSSMATCH)  TROPONIN I (HIGH SENSITIVITY)  TROPONIN I (HIGH SENSITIVITY)    EKG EKG Interpretation  Date/Time:  Monday May 09 2021 18:49:28 EST Ventricular Rate:  136 PR Interval:  132 QRS Duration: 104 QT Interval:  312 QTC Calculation: 469 R Axis:   86 Text Interpretation: Sinus tachycardia Otherwise normal ECG When compared with ECG of 17-Jun-2020 11:48, SINCE LAST TRACING HEART RATE HAS INCREASED Confirmed by Alvira Monday ([id number]) on 05/09/2021 9:03:03 PM  Radiology DG Chest 2 View  Result Date: 05/09/2021 CLINICAL DATA:  Lays for 10 days, history of COPD EXAM: CHEST - 2 VIEW COMPARISON:  Chest radiograph 09/17/2019 FINDINGS: The cardiomediastinal silhouette is normal. There is no focal consolidation or pulmonary edema. There is no pleural effusion or pneumothorax. There is no acute osseous abnormality. IMPRESSION: No radiographic  evidence of acute cardiopulmonary process. Electronically Signed   By: Lesia Hausen M.D.   On: 05/09/2021 15:16    Procedures .Critical Care Performed by: Alvira Monday, MD Authorized by: Alvira Monday, MD   Critical care provider statement:    Critical care time (minutes):  30   Critical care was time spent personally by me on the following activities:  Development of treatment plan with patient or surrogate, discussions with consultants, evaluation of patient's response to treatment, examination of patient, ordering and review of laboratory studies, ordering and review of radiographic studies, ordering and performing treatments and interventions,  pulse oximetry and review of old charts    Medications Ordered in ED Medications  0.9 %  sodium chloride infusion (has no administration in time range)  pantoprozole (PROTONIX) 80 mg /NS 100 mL infusion (8 mg/hr Intravenous New Bag/Given 05/09/21 2331)  pantoprazole (PROTONIX) injection 40 mg (has no administration in time range)  pantoprazole (PROTONIX) 80 mg /NS 100 mL IVPB (0 mg Intravenous Stopped 05/09/21 2330)    ED Course/ Medical Decision Making/ A&P                           Medical Decision Making Amount and/or Complexity of Data Reviewed Labs: ordered.  Risk Prescription drug management. Decision regarding hospitalization.    56 year old male with history of thoracic aortic aneurysm, COPD, presents with concern for 2 weeks of melena, and shortness of breath and fatigue for 3 days.  Reviewed chest x-ray that was completed at urgent care which showed no evidence of pneumonia, pulmonary edema or other abnormalities. EKG evaluate me and shows sinus tachycardia  Personally reviewed and interpreted labs which showed hemoglobin of 6.3, decreased from 15.3 in March 2022.  Repeat hemoglobin was completed today 4 hours after the first, which showed stable hemoglobin of 6.  History provided by patient consistent with likely  upper GI bleed, with report of melena for the last 2 weeks, and he is also noted to have melena was Hemoccult positive on exam.    Consented for 1U pRBC to start and initiated Protonix bolus and drip. Hx of etoh use, no hx of withdrawal, no hx of cirrhosis.    Consulted hospitalist for admission, with [facility], Dr. Georga Bora.           Final Clinical Impression(s) / ED Diagnoses Final diagnoses:  Symptomatic anemia  Upper GI bleed    Rx / DC Orders ED Discharge Orders     None         Alvira Monday, MD 05/10/21 0150

## 2021-05-09 NOTE — ED Triage Notes (Signed)
Pt presents with c/o chest soreness and upper body weakness.   States he often feels tired.

## 2021-05-09 NOTE — Discharge Instructions (Addendum)
Please continue current medications.  Your chest x-ray was normal today.  We will let you know with any abnormal blood work.  Please schedule a follow up with your primary care provider if your symptoms do not gradually improve.

## 2021-05-09 NOTE — ED Notes (Signed)
Blood consent provided to pt in spanish - pt signed blood consent at bedside

## 2021-05-09 NOTE — ED Notes (Signed)
Pt is getting transfused at this time - cannot collect blood at this time

## 2021-05-09 NOTE — ED Provider Notes (Signed)
[facility]    CSN: [id number] Arrival date & time: 05/09/21  1315      History   Chief Complaint Chief Complaint  Patient presents with   Fatigue    HPI Scott Hanna is a 56 y.o. male.   Patient presents with wife and interpreter Verdis Frederickson for "feeling bad" since last Saturday.  He reports he feels more tired then normal and performing ADLs like getting dressed makes him feel like he cannot take a deep breath.  He also feels he lost a lot of strength in his arms and legs.  Denies fever, chills, cough, wheezing, chest pain, congestion, orthopnea, shortness of breath, runny nose, sneezing, sore throat, sinus pressure, ear pain, nausea/vomiting/diarrhea, and change in appetite.  He does have a history of chronic bronchitis, however has not needed any inhalers in the past.   He takes Trazodone 25 - 50 mg nightly and last Friday, he drank some alcohol with the medication and is wondering if this may have caused his symptoms.   History reviewed. No pertinent past medical history.  Patient Active Problem List   Diagnosis Date Noted   Insomnia 01/10/2021   Situational anxiety 01/10/2021   Rectal bleeding 01/10/2021   Dyslipidemia 08/19/2020   Post-void dribbling 08/19/2020   Thoracic aortic aneurysm without rupture 06/17/2020   Chronic obstructive pulmonary disease ([place]) 02/05/2020    History reviewed. No pertinent surgical history.   Home Medications    Prior to Admission medications   Medication Sig Start Date End Date Taking? Authorizing Provider  buPROPion (WELLBUTRIN SR) 150 MG 12 hr tablet Take 1 tablet (150 mg total) by mouth daily. Patient not taking: Reported on 06/17/2020 09/19/19 12/18/19  Horald Pollen, MD  guaiFENesin (MUCINEX) 600 MG 12 hr tablet Take 2 tablets (1,200 mg total) by mouth 2 (two) times daily as needed for to loosen phlegm. Patient not taking: Reported on 01/10/2021 08/19/20   Horald Pollen, MD  meclizine (ANTIVERT) 25 MG  tablet Take 1-2 tablets (25-50 mg total) by mouth 3 (three) times daily as needed for dizziness. Patient not taking: No sig reported 06/17/20   Lorin Glass, PA-C  rosuvastatin (CRESTOR) 20 MG tablet Take 1 tablet (20 mg total) by mouth daily. 08/19/20   Horald Pollen, MD  traZODone (DESYREL) 50 MG tablet Take 0.5-1 tablets (25-50 mg total) by mouth at bedtime as needed for sleep. 01/10/21   Horald Pollen, MD    Family History History reviewed. No pertinent family history.  Social History Social History   Tobacco Use   Smoking status: Former    Packs/day: 1.00    Types: Cigarettes   Smokeless tobacco: Never   Tobacco comments:    6 months ago stopped smoking.    Substance Use Topics   Alcohol use: Not Currently   Drug use: Never     Allergies   Patient has no known allergies.   Review of Systems Review of Systems  Constitutional:  Positive for fatigue. Negative for activity change, appetite change, chills, diaphoresis and fever.  HENT: Negative.  Negative for congestion.   Respiratory: Negative.  Negative for chest tightness, shortness of breath and wheezing.   Cardiovascular: Negative.  Negative for chest pain, palpitations and leg swelling.  Gastrointestinal: Negative.  Negative for blood in stool, constipation, diarrhea, nausea and vomiting.  Musculoskeletal: Negative.  Negative for arthralgias, gait problem and myalgias.  Skin: Negative.  Negative for rash.  Neurological:  Positive for weakness (arms and legs)  and headaches (off and on). Negative for dizziness, syncope, facial asymmetry, speech difficulty, light-headedness and numbness.  Psychiatric/Behavioral:  Negative for confusion. The patient is not nervous/anxious.     Physical Exam Triage Vital Signs ED Triage Vitals  Enc Vitals Group     BP 05/09/21 1432 122/86     Pulse Rate 05/09/21 1432 (!) 114     Resp 05/09/21 1432 17     Temp 05/09/21 1432 98.1 F (36.7 C)     Temp Source  05/09/21 1432 Oral     SpO2 05/09/21 1432 98 %     Weight --      Height --      Head Circumference --      Peak Flow --      Pain Score 05/09/21 1431 0     Pain Loc --      Pain Edu? --      Excl. in [place]? --    No data found.  Updated Vital Signs BP 122/86 (BP Location: Left Arm)    Pulse (!) 114    Temp 98.1 F (36.7 C) (Oral)    Resp 17    SpO2 98%   Visual Acuity Right Eye Distance:   Left Eye Distance:   Bilateral Distance:    Right Eye Near:   Left Eye Near:    Bilateral Near:     Physical Exam Vitals and nursing note reviewed.  Constitutional:      General: He is not in acute distress.    Appearance: Normal appearance. He is not ill-appearing or toxic-appearing.  HENT:     Head: Normocephalic and atraumatic.     Right Ear: Tympanic membrane, ear canal and external ear normal.     Left Ear: Tympanic membrane, ear canal and external ear normal.     Nose: Nose normal. No congestion or rhinorrhea.     Mouth/Throat:     Mouth: Mucous membranes are moist.     Pharynx: Oropharynx is clear. No oropharyngeal exudate or posterior oropharyngeal erythema.  Eyes:     General: No scleral icterus.    Extraocular Movements: Extraocular movements intact.  Cardiovascular:     Rate and Rhythm: Normal rate and regular rhythm.     Heart sounds: Normal heart sounds. No murmur heard. Pulmonary:     Effort: Pulmonary effort is normal. No respiratory distress.     Breath sounds: Normal breath sounds. No wheezing, rhonchi or rales.  Musculoskeletal:     Cervical back: Normal range of motion.     Right lower leg: No edema.     Left lower leg: No edema.     Comments: Moving all 4 extremities equally   Lymphadenopathy:     Cervical: No cervical adenopathy.  Skin:    General: Skin is warm and dry.     Capillary Refill: Capillary refill takes less than 2 seconds.     Coloration: Skin is not jaundiced or pale.     Findings: No erythema.  Neurological:     Mental Status: He is alert  and oriented to person, place, and time.     Motor: No weakness.     Gait: Gait normal.  Psychiatric:        Mood and Affect: Mood normal.        Behavior: Behavior normal.        Thought Content: Thought content normal.        Judgment: Judgment normal.     UC Treatments /  Results  Labs (all labs ordered are listed, but only abnormal results are displayed) Labs Reviewed  CBC  COMPREHENSIVE METABOLIC PANEL    EKG   Radiology DG Chest 2 View  Result Date: 05/09/2021 CLINICAL DATA:  Lays for 10 days, history of COPD EXAM: CHEST - 2 VIEW COMPARISON:  Chest radiograph 09/17/2019 FINDINGS: The cardiomediastinal silhouette is normal. There is no focal consolidation or pulmonary edema. There is no pleural effusion or pneumothorax. There is no acute osseous abnormality. IMPRESSION: No radiographic evidence of acute cardiopulmonary process. Electronically Signed   By: Valetta Mole M.D.   On: 05/09/2021 15:16    Procedures Procedures (including critical care time)  Medications Ordered in UC Medications - No data to display  Initial Impression / Assessment and Plan / UC Course  I have reviewed the triage vital signs and the nursing notes.  Pertinent labs & imaging results that were available during my care of the patient were reviewed by me and considered in my medical decision making (see chart for details).     Symptoms and exam consistent with possibly post viral syndrome.  However, differential remains wide.  Chest x-ray does not show acute abnormality.   Examination today is unremarkable and is reassuring.  Will check blood counts to rule out anemia and electrolytes, kidney function, liver enzymes, to rule out metabolic cause of symptoms.  I encouraged him to push fluid intake with water.  We will call him with any abnormal blood work.  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  Follow up with PCP if symptoms do not improve over next couple of days.  Patient in agreement with  this plan.  Final Clinical Impressions(s) / UC Diagnoses   Final diagnoses:  Other fatigue  Weakness     Discharge Instructions      Please continue current medications.  Your chest x-ray was normal today.  We will let you know with any abnormal blood work.  Please schedule a follow up with your primary care provider if your symptoms do not gradually improve.      ED Prescriptions   None    PDMP not reviewed this encounter.   Eulogio Bear, NP 05/09/21 1600

## 2021-05-09 NOTE — Telephone Encounter (Signed)
Called patient with assistance of Spanish speaking interpreter.  Patient notified regarding critically low hemoglobin level of 6.3.  I recommend he go to the Emergency Department as soon as able to receive a blood transfusion and evaluate cause of anemia.  Patient verbalized understanding and all questions answered.

## 2021-05-10 ENCOUNTER — Inpatient Hospital Stay (HOSPITAL_COMMUNITY): Payer: BC Managed Care – PPO | Admitting: Certified Registered"

## 2021-05-10 ENCOUNTER — Encounter (HOSPITAL_COMMUNITY)
Admission: EM | Disposition: A | Discharge status: Home / Self Care | Payer: Self-pay | Source: Home / Self Care | Attending: Internal Medicine

## 2021-05-10 ENCOUNTER — Encounter (HOSPITAL_COMMUNITY): Payer: Self-pay | Admitting: Internal Medicine

## 2021-05-10 DIAGNOSIS — D72829 Elevated white blood cell count, unspecified: Secondary | ICD-10-CM | POA: Diagnosis present

## 2021-05-10 DIAGNOSIS — E871 Hypo-osmolality and hyponatremia: Secondary | ICD-10-CM

## 2021-05-10 DIAGNOSIS — K259 Gastric ulcer, unspecified as acute or chronic, without hemorrhage or perforation: Secondary | ICD-10-CM

## 2021-05-10 DIAGNOSIS — K922 Gastrointestinal hemorrhage, unspecified: Secondary | ICD-10-CM | POA: Diagnosis not present

## 2021-05-10 DIAGNOSIS — K269 Duodenal ulcer, unspecified as acute or chronic, without hemorrhage or perforation: Secondary | ICD-10-CM

## 2021-05-10 DIAGNOSIS — Z789 Other specified health status: Secondary | ICD-10-CM | POA: Diagnosis present

## 2021-05-10 DIAGNOSIS — R739 Hyperglycemia, unspecified: Secondary | ICD-10-CM | POA: Diagnosis present

## 2021-05-10 HISTORY — PX: ESOPHAGOGASTRODUODENOSCOPY (EGD) WITH PROPOFOL: SHX5813

## 2021-05-10 HISTORY — PX: BIOPSY: SHX5522

## 2021-05-10 LAB — HIV ANTIBODY (ROUTINE TESTING W REFLEX): HIV Screen 4th Generation wRfx: NONREACTIVE

## 2021-05-10 LAB — CBC
HCT: 18.3 % — ABNORMAL LOW (ref 39.0–52.0)
Hemoglobin: 5.9 g/dL — CL (ref 13.0–17.0)
MCH: 29.4 pg (ref 26.0–34.0)
MCHC: 32.2 g/dL (ref 30.0–36.0)
MCV: 91 fL (ref 80.0–100.0)
Platelets: 237 10*3/uL (ref 150–400)
RBC: 2.01 MIL/uL — ABNORMAL LOW (ref 4.22–5.81)
RDW: 15.3 % (ref 11.5–15.5)
WBC: 14.9 10*3/uL — ABNORMAL HIGH (ref 4.0–10.5)
nRBC: 0.9 % — ABNORMAL HIGH (ref 0.0–0.2)

## 2021-05-10 LAB — HEMOGLOBIN AND HEMATOCRIT, BLOOD
HCT: 22.9 % — ABNORMAL LOW (ref 39.0–52.0)
HCT: 22.6 % — ABNORMAL LOW (ref 39.0–52.0)
Hemoglobin: 7.8 g/dL — ABNORMAL LOW (ref 13.0–17.0)
Hemoglobin: 7.5 g/dL — ABNORMAL LOW (ref 13.0–17.0)

## 2021-05-10 LAB — GLUCOSE, CAPILLARY
Glucose-Capillary: 82 mg/dL (ref 70–99)
Glucose-Capillary: 91 mg/dL (ref 70–99)
Glucose-Capillary: 99 mg/dL (ref 70–99)

## 2021-05-10 LAB — CBG MONITORING, ED: Glucose-Capillary: 111 mg/dL — ABNORMAL HIGH (ref 70–99)

## 2021-05-10 LAB — PREPARE RBC (CROSSMATCH)

## 2021-05-10 LAB — PROCALCITONIN: Procalcitonin: <0.1 ng/mL

## 2021-05-10 SURGERY — ESOPHAGOGASTRODUODENOSCOPY (EGD) WITH PROPOFOL
Anesthesia: Monitor Anesthesia Care

## 2021-05-10 MED ORDER — LORAZEPAM 2 MG/ML IJ SOLN: 1.0000 mg | INTRAMUSCULAR | Status: DC | PRN

## 2021-05-10 MED ORDER — FOLIC ACID 1 MG PO TABS: 1.0000 mg | ORAL_TABLET | Freq: Every day | ORAL | Status: DC

## 2021-05-10 MED ORDER — ROSUVASTATIN CALCIUM 20 MG PO TABS
20.0000 mg | ORAL_TABLET | Freq: Every day | ORAL | Status: DC
  Administered 2021-05-11 – 2021-05-12 (×2): 20 mg via ORAL
  Filled 2021-05-10 (×2): qty 1

## 2021-05-10 MED ORDER — PEG-KCL-NACL-NASULF-NA ASC-C 100 G PO SOLR: 1.0000 | Freq: Once | ORAL | Status: DC

## 2021-05-10 MED ORDER — PEG-KCL-NACL-NASULF-NA ASC-C 100 G PO SOLR
0.5000 | Freq: Once | ORAL | Status: AC
  Administered 2021-05-10: 100 g via ORAL
  Filled 2021-05-10: qty 1

## 2021-05-10 MED ORDER — SODIUM CHLORIDE 0.9% IV SOLUTION: Freq: Once | INTRAVENOUS | Status: AC

## 2021-05-10 MED ORDER — PROPOFOL 500 MG/50ML IV EMUL
INTRAVENOUS | Status: DC | PRN
  Administered 2021-05-10: 100 ug/kg/min via INTRAVENOUS

## 2021-05-10 MED ORDER — THIAMINE HCL 100 MG PO TABS
100.0000 mg | ORAL_TABLET | Freq: Every day | ORAL | Status: DC
  Administered 2021-05-11 – 2021-05-12 (×2): 100 mg via ORAL
  Filled 2021-05-10 (×2): qty 1

## 2021-05-10 MED ORDER — IPRATROPIUM-ALBUTEROL 0.5-2.5 (3) MG/3ML IN SOLN: 3.0000 mL | Freq: Four times a day (QID) | RESPIRATORY_TRACT | Status: DC | PRN

## 2021-05-10 MED ORDER — PROPOFOL 10 MG/ML IV BOLUS
INTRAVENOUS | Status: DC | PRN
Start: 2021-05-10 — End: 2021-05-10
  Administered 2021-05-10: 30 mg via INTRAVENOUS
  Administered 2021-05-10: 20 mg via INTRAVENOUS

## 2021-05-10 MED ORDER — SODIUM CHLORIDE 0.9 % IV SOLN
INTRAVENOUS | Status: DC
Start: 2021-05-10 — End: 2021-05-10

## 2021-05-10 MED ORDER — ADULT MULTIVITAMIN W/MINERALS CH
1.0000 | ORAL_TABLET | Freq: Every day | ORAL | Status: DC
  Administered 2021-05-11 – 2021-05-12 (×2): 1 via ORAL
  Filled 2021-05-10 (×2): qty 1

## 2021-05-10 MED ORDER — FOLIC ACID 1 MG PO TABS
1.0000 mg | ORAL_TABLET | Freq: Every day | ORAL | Status: DC
Start: 2021-05-10 — End: 2021-05-12
  Administered 2021-05-11 – 2021-05-12 (×2): 1 mg via ORAL
  Filled 2021-05-10 (×2): qty 1

## 2021-05-10 MED ORDER — FOLIC ACID 5 MG/ML IJ SOLN
1.0000 mg | Freq: Every day | INTRAMUSCULAR | Status: DC
  Administered 2021-05-10: 1 mg via INTRAVENOUS
  Filled 2021-05-10 (×3): qty 0.2

## 2021-05-10 MED ORDER — SODIUM CHLORIDE 0.9 % IV BOLUS
1000.0000 mL | Freq: Once | INTRAVENOUS | Status: AC
  Administered 2021-05-10: 1000 mL via INTRAVENOUS

## 2021-05-10 MED ORDER — THIAMINE HCL 100 MG/ML IJ SOLN
100.0000 mg | Freq: Every day | INTRAMUSCULAR | Status: DC
  Administered 2021-05-10: 100 mg via INTRAVENOUS
  Filled 2021-05-10 (×3): qty 2

## 2021-05-10 MED ORDER — PHENYLEPHRINE 40 MCG/ML (10ML) SYRINGE FOR IV PUSH (FOR BLOOD PRESSURE SUPPORT)
PREFILLED_SYRINGE | INTRAVENOUS | Status: DC | PRN
  Administered 2021-05-10 (×2): 80 ug via INTRAVENOUS

## 2021-05-10 MED ORDER — LORAZEPAM 1 MG PO TABS: 1.0000 mg | ORAL_TABLET | ORAL | Status: DC | PRN

## 2021-05-10 MED ORDER — SODIUM CHLORIDE 0.9 % IV SOLN: INTRAVENOUS | Status: DC

## 2021-05-10 SURGICAL SUPPLY — 15 items

## 2021-05-10 NOTE — H&P (View-Only) (Signed)
Consultation  Referring Provider: [facility]/ [place] Primary Care Physician:  Horald Pollen, MD Primary Gastroenterologist:  none  Reason for Consultation:  GI bleed  HPI: Scott Hanna is a 56 y.o. male, non English-speaking/Hispanic who presented to urgent care yesterday with complaints of shortness of breath and fatigue over the previous 3 days and had also complained of black stool over the past 2 weeks.  He was sent to the emergency room after initial labs revealed hemoglobin of 6.3.  Per the notes he had been taking ibuprofen on a frequent basis, intermittently takes aspirin and uses alcohol regularly usually 2 to 3 cups/day.  No prior GI history.  He does have history of COPD, thoracic aneurysm, hyperlipidemia. Patient  was tachycardic on arrival to the emergency room, not hypotensive. He has been started on PPI infusion and is n.p.o. Initial labs show WBC 17.1/hemoglobin 6.3/hematocrit 20.9/MCV 89/platelets 315 Troponin negative, COVID/influenza negative BUN 15/creatinine 0.84 LFTs with AST 27 ALT 46  This a.m. WBC 14.9/hemoglobin 5.9/hematocrit 18.3.  He is being transfused a total of 3 units. Review  of remote labs showed hemoglobin of 16.6 in June 2021.  Patient has not had any prior GI evaluation though he tells me that a doctor in the [place] had told him that he had gastritis many years ago, no EGD was done.  Patient has remained stable overnight, no further melena, no complaints of abdominal pain no nausea or vomiting.   Past Medical History:  Diagnosis Date   COPD (chronic obstructive pulmonary disease) ([facility])    GI bleed    Thoracic aortic aneurysm without rupture     History reviewed. No pertinent surgical history.  Prior to Admission medications   Medication Sig Start Date End Date Taking? Authorizing Provider  acetaminophen (TYLENOL) 500 MG tablet Take 1,000 mg by mouth every 6 (six) hours as needed for moderate pain or headache.   Yes  [provider]  aspirin (ASPIRIN 81) 81 MG EC tablet Take 81 mg by mouth every 3 (three) days. Swallow whole.   Yes [provider]  MELATONIN GUMMIES PO Take 20 mg by mouth at bedtime as needed (sleep).   Yes [provider]  rosuvastatin (CRESTOR) 20 MG tablet Take 1 tablet (20 mg total) by mouth daily. 08/19/20  Yes Sagardia, Ines Bloomer, MD  traZODone (DESYREL) 50 MG tablet Take 0.5-1 tablets (25-50 mg total) by mouth at bedtime as needed for sleep. Patient taking differently: Take 50-75 mg by mouth at bedtime. 01/10/21  Yes Horald Pollen, MD  buPROPion [facility] SR) 150 MG 12 hr tablet Take 1 tablet (150 mg total) by mouth daily. Patient not taking: Reported on 06/17/2020 09/19/19 12/18/19  Horald Pollen, MD  guaiFENesin (MUCINEX) 600 MG 12 hr tablet Take 2 tablets (1,200 mg total) by mouth 2 (two) times daily as needed for to loosen phlegm. Patient not taking: Reported on 01/10/2021 08/19/20   Horald Pollen, MD  meclizine (ANTIVERT) 25 MG tablet Take 1-2 tablets (25-50 mg total) by mouth 3 (three) times daily as needed for dizziness. Patient not taking: Reported on 08/19/2020 06/17/20   Lorin Glass, PA-C    Current Facility-Administered Medications  Medication Dose Route Frequency Provider Last Rate Last Admin   0.9 %  sodium chloride infusion (Manually program via Guardrails IV Fluids)   Intravenous Once Shela Leff, MD       0.9 %  sodium chloride infusion   Intravenous Continuous Shela Leff, MD 125  mL/hr at 05/10/21 0358 New Bag at [id number]   folic acid (FOLVITE) tablet 1 mg  1 mg Oral Daily Shela Leff, MD       ipratropium-albuterol (DUONEB) 0.5-2.5 (3) MG/3ML nebulizer solution 3 mL  3 mL Nebulization Q6H PRN Shela Leff, MD       LORazepam (ATIVAN) tablet 1-4 mg  1-4 mg Oral Q1H PRN Shela Leff, MD       Or   LORazepam (ATIVAN) injection 1-4 mg  1-4 mg Intravenous Q1H PRN Shela Leff, MD       multivitamin with minerals tablet 1 tablet  1 tablet Oral Daily Shela Leff, MD       Derrill Memo ON 05/13/2021] pantoprazole (PROTONIX) injection 40 mg  40 mg Intravenous Q12H Shela Leff, MD       pantoprozole (PROTONIX) 80 mg /NS 100 mL infusion  8 mg/hr Intravenous Continuous Shela Leff, MD 10 mL/hr at 05/09/21 2331 8 mg/hr at 05/09/21 2331   rosuvastatin (CRESTOR) tablet 20 mg  20 mg Oral Daily Shela Leff, MD       thiamine tablet 100 mg  100 mg Oral Daily Shela Leff, MD       Or   thiamine (B-1) injection 100 mg  100 mg Intravenous Daily Shela Leff, MD       Current Outpatient Medications  Medication Sig Dispense Refill   acetaminophen (TYLENOL) 500 MG tablet Take 1,000 mg by mouth every 6 (six) hours as needed for moderate pain or headache.     aspirin (ASPIRIN 81) 81 MG EC tablet Take 81 mg by mouth every 3 (three) days. Swallow whole.     MELATONIN GUMMIES PO Take 20 mg by mouth at bedtime as needed (sleep).     rosuvastatin (CRESTOR) 20 MG tablet Take 1 tablet (20 mg total) by mouth daily. 90 tablet 3   traZODone (DESYREL) 50 MG tablet Take 0.5-1 tablets (25-50 mg total) by mouth at bedtime as needed for sleep. (Patient taking differently: Take 50-75 mg by mouth at bedtime.) 30 tablet 3   buPROPion (WELLBUTRIN SR) 150 MG 12 hr tablet Take 1 tablet (150 mg total) by mouth daily. (Patient not taking: Reported on 06/17/2020) 90 tablet 1   guaiFENesin (MUCINEX) 600 MG 12 hr tablet Take 2 tablets (1,200 mg total) by mouth 2 (two) times daily as needed for to loosen phlegm. (Patient not taking: Reported on 01/10/2021) 30 tablet 5   meclizine (ANTIVERT) 25 MG tablet Take 1-2 tablets (25-50 mg total) by mouth 3 (three) times daily as needed for dizziness. (Patient not taking: Reported on 08/19/2020) 30 tablet 0    Allergies as of 05/09/2021   (No Known Allergies)    History reviewed. No pertinent family history.  Social History    Socioeconomic History   Marital status: Married    Spouse name: Not on file   Number of children: Not on file   Years of education: Not on file   Highest education level: Not on file  Occupational History   Not on file  Tobacco Use   Smoking status: Former    Packs/day: 1.00    Types: Cigarettes   Smokeless tobacco: Never   Tobacco comments:    6 months ago stopped smoking.    Substance and Sexual Activity   Alcohol use: Not Currently   Drug use: Never   Sexual activity: Yes  Other Topics Concern   Not on file  Social History Narrative   Not on file  Social Determinants of Health   Financial Resource Strain: Not on file  Food Insecurity: Not on file  Transportation Needs: Not on file  Physical Activity: Not on file  Stress: Not on file  Social Connections: Not on file  Intimate Partner Violence: Not on file    Review of Systems: Pertinent positive and negative review of systems were noted in the above HPI section.  All other review of systems was otherwise negative.   Physical Exam: Vital signs in last 24 hours: Temp:  [97.4 F (36.3 C)-99.6 F (37.6 C)] 98.8 F (37.1 C) (02/21 0822) Pulse Rate:  [96-141] 103 (02/21 0822) Resp:  [14-27] 20 (02/21 0822) BP: (96-138)/(63-89) 120/73 (02/21 0822) SpO2:  [94 %-100 %] 100 % (02/21 0815) Weight:  [108.9 kg] 108.9 kg (02/20 1900)   General:   Alert,  Well-developed, well-nourished, pleasant and cooperative in NAD older Hispanic male, speaks some English Head:  Normocephalic and atraumatic. Eyes:  Sclera clear, no icterus.   Conjunctiva pale Ears:  Normal auditory acuity. Nose:  No deformity, discharge,  or lesions. Mouth:  No deformity or lesions.   Neck:  Supple; no masses or thyromegaly. Lungs:  Clear throughout to auscultation.   No wheezes, crackles, or rhonchi.  Heart:  Regular rate and rhythm; no murmurs, clicks, rubs,  or gallops. Abdomen:  Soft,nontender, BS active,nonpalp mass or hsm.   Rectal: Not  done Msk:  Symmetrical without gross deformities. . Pulses:  Normal pulses noted. Extremities:  Without clubbing or edema. Neurologic:  Alert and  oriented x4;  grossly normal neurologically. Skin:  Intact without significant lesions or rashes.. Psych:  Alert and cooperative. Normal mood and affect.  Intake/Output from previous day: 02/20 0701 - 02/21 0700 In: -  Out: 825 [Urine:825] Intake/Output this shift: Total I/O In: 257.5 [Blood:257.5] Out: -   Lab Results: Recent Labs    05/09/21 1523 05/09/21 1912 05/10/21 0507  WBC 17.9* 17.1* 14.9*  HGB 6.3* 6.0* 5.9*  HCT 20.9* 19.0* 18.3*  PLT 315 290 237   BMET Recent Labs    05/09/21 1523 05/09/21 1912  NA 139 132*  K 4.2 4.1  CL 104 101  CO2 29 25  GLUCOSE 142* 208*  BUN 15 18  CREATININE 0.84 0.90  CALCIUM 8.5* 8.0*   LFT Recent Labs    05/09/21 1523  PROT 5.7*  ALBUMIN 3.0*  AST 27  ALT 46*  ALKPHOS 38  BILITOT 0.2*   PT/INR No results for input(s): LABPROT, INR in the last 72 hours. Hepatitis Panel No results for input(s): HEPBSAG, HCVAB, HEPAIGM, HEPBIGM in the last 72 hours.   IMPRESSION:  #73 56 year old Hispanic male, presenting with 3-day history of fatigue and shortness of breath and 2-week history of black stool at home.  Found to be profoundly anemic/microcytic with hemoglobin at presentation of 6.3  Patient has been hemodynamically stable, he is completing 1/3 unit of packed RBCs and follow-up hemoglobin pending  In setting of daily EtOH use and very regular NSAID use concern for peptic ulcer disease, no lab evidence for cirrhosis, cannot absolutely rule out lower GI source for bleeding  #2 anemia normocytic secondary to acute blood loss #3 regular EtOH use #4 COPD  PLAN: Patient n.p.o. Continue PPI infusion Patient has been scheduled for EGD with Dr. Lyndel Safe today.  Procedure was discussed in detail with patient including indications risk and benefits and he is agreeable to proceed If  EGD is unrevealing will need further cross-sectional imaging and colonoscopy.  Continue to trend hemoglobin every 6 to 8 hours and transfuse to keep hemoglobin above 7 Further recommendations pending findings at EGD   [place] PA-C 05/10/2021, 8:54 AM     Attending physician's note   I have taken history, reviewed the chart and examined the patient. I performed a substantive portion of this encounter, including complete performance of at least one of the key components, in conjunction with the APP. I agree with the Advanced Practitioner's note, impression and recommendations.  Patient speaks little [place].  GI bleed with H/O melena on NSAIDs with H/O ETOH use/abuse. Hb 15.3 (05/2020) to 6.3 s/p 3U. HD stable. No biochemical evidence of liver cirrhosis.  H/O hematochezia in past-attributed to hemorrhoids.  H/O COPD, small (3.5 cm, CTA 05/2020) thoracic aneurysm, HLD  Plan: -IV Protonix. -Trend CBC -Semiemergent EGD today.  Discussed risks and benefits. -If neg, CTA followed by colon. -Avoid nonsteroidals   Carmell Austria, MD [facility] [phone]

## 2021-05-10 NOTE — H&P (Addendum)
History and Physical    Mit Issac JWL:[id number] DOB: 11-Nov-1965 DOA: 05/09/2021  PCP: Georgina Quint, MD  Patient coming from: Home  HPI: Patient is a 56 year old Spanish-speaking male with a past medical history of COPD, rectal bleeding, thoracic aortic aneurysm, hyperlipidemia, anxiety.  He was seen at urgent care yesterday for fatigue and generalized weakness.  Blood work revealed low hemoglobin of 6.3 and he was sent to the ED for blood transfusion and further evaluation.  In the ED, patient was tachycardic but not hypotensive.  He endorsed melena in the setting of NSAID and frequent alcohol use.  Labs revealed WBC 17.1.  Hemoglobin 6.0.  FOBT positive.  Glucose 208.  UA without signs of infection.  High-sensitivity troponin negative.  COVID and influenza PCR negative.  Chest x-ray not suggestive of pneumonia. Patient was given IV Protonix bolus and started on continuous infusion.  1 unit PRBCs ordered.  Spanish interpreter services used.  Patient reports 2-week history of melena.  Reports lightheadedness, generalized weakness, and dyspnea for the past few days.  Denies hematemesis.  Does report history of hematochezia since last year and is PCP referred him to GI for colonoscopy.  Patient states he missed his GI appointment in December as he had to travel to his country a week before this appointment.  States he is from [place] and was told in the past by a physician over there that he had "gastritis" but never had endoscopy done.  Reports chronic alcohol use -2 to 3 cups of whiskey and rum every day.  He takes aspirin 2 or 3 times a week.  Also takes ibuprofen and diclofenac regularly for chronic pain/headaches.  Patient denies chest pain.  No other complaints.   Review of Systems:  All systems reviewed and apart from history of presenting illness, are negative.  Past Medical History:  Diagnosis Date   COPD (chronic obstructive pulmonary disease) ([facility])    GI  bleed    Thoracic aortic aneurysm without rupture     No past surgical history on file.   reports that he has quit smoking. His smoking use included cigarettes. He smoked an average of 1 pack per day. He has never used smokeless tobacco. He reports that he does not currently use alcohol. He reports that he does not use drugs.  No Known Allergies  History reviewed. No pertinent family history.  Prior to Admission medications   Medication Sig Start Date End Date Taking? Authorizing Provider  acetaminophen (TYLENOL) 500 MG tablet Take 1,000 mg by mouth every 6 (six) hours as needed for moderate pain or headache.   Yes [provider]  aspirin (ASPIRIN 81) 81 MG EC tablet Take 81 mg by mouth every 3 (three) days. Swallow whole.   Yes [provider]  MELATONIN GUMMIES PO Take 20 mg by mouth at bedtime as needed (sleep).   Yes [provider]  rosuvastatin (CRESTOR) 20 MG tablet Take 1 tablet (20 mg total) by mouth daily. 08/19/20  Yes Sagardia, Eilleen Kempf, MD  traZODone (DESYREL) 50 MG tablet Take 0.5-1 tablets (25-50 mg total) by mouth at bedtime as needed for sleep. Patient taking differently: Take 50-75 mg by mouth at bedtime. 01/10/21  Yes Georgina Quint, MD  buPROPion [facility] SR) 150 MG 12 hr tablet Take 1 tablet (150 mg total) by mouth daily. Patient not taking: Reported on 06/17/2020 09/19/19 12/18/19  Georgina Quint, MD  guaiFENesin (MUCINEX) 600 MG 12 hr tablet Take 2 tablets (1,200  mg total) by mouth 2 (two) times daily as needed for to loosen phlegm. Patient not taking: Reported on 01/10/2021 08/19/20   Georgina Quint, MD  meclizine (ANTIVERT) 25 MG tablet Take 1-2 tablets (25-50 mg total) by mouth 3 (three) times daily as needed for dizziness. Patient not taking: Reported on 08/19/2020 06/17/20   Cristina Gong, [place]    Physical Exam: Vitals:   05/10/21 0115 05/10/21 0130 05/10/21 0142 05/10/21 0145  BP: 114/77 131/89 112/78  110/71  Pulse: (!) 115 (!) 123 (!) 116 (!) 114  Resp: 16 19 (!) 25 16  Temp:   99 F (37.2 C)   TempSrc:   Oral   SpO2: 100% 100% 99% 99%  Weight:      Height:        Physical Exam Constitutional:      General: He is not in acute distress. HENT:     Head: Normocephalic and atraumatic.  Eyes:     Extraocular Movements: Extraocular movements intact.     Conjunctiva/sclera: Conjunctivae normal.  Cardiovascular:     Rate and Rhythm: Regular rhythm. Tachycardia present.     Pulses: Normal pulses.  Pulmonary:     Effort: Pulmonary effort is normal. No respiratory distress.     Breath sounds: Normal breath sounds. No wheezing or rales.  Abdominal:     General: Bowel sounds are normal. There is no distension.     Palpations: Abdomen is soft.     Tenderness: There is no abdominal tenderness. There is no guarding or rebound.  Musculoskeletal:        General: No swelling or tenderness.     Cervical back: Normal range of motion and neck supple.  Skin:    General: Skin is warm and dry.  Neurological:     General: No focal deficit present.     Mental Status: He is alert and oriented to person, place, and time.     Labs on Admission: I have personally reviewed following labs and imaging studies  CBC: Recent Labs  Lab 05/09/21 1523 05/09/21 1912  WBC 17.9* 17.1*  HGB 6.3* 6.0*  HCT 20.9* 19.0*  MCV 89.7 91.3  PLT 315 290   Basic Metabolic Panel: Recent Labs  Lab 05/09/21 1523 05/09/21 1912  NA 139 132*  K 4.2 4.1  CL 104 101  CO2 29 25  GLUCOSE 142* 208*  BUN 15 18  CREATININE 0.84 0.90  CALCIUM 8.5* 8.0*   GFR: Estimated Creatinine Clearance: 115.4 mL/min (by C-G formula based on SCr of 0.9 mg/dL). Liver Function Tests: Recent Labs  Lab 05/09/21 1523  AST 27  ALT 46*  ALKPHOS 38  BILITOT 0.2*  PROT 5.7*  ALBUMIN 3.0*   No results for input(s): LIPASE, AMYLASE in the last 168 hours. No results for input(s): AMMONIA in the last 168 hours. Coagulation  Profile: No results for input(s): INR, PROTIME in the last 168 hours. Cardiac Enzymes: No results for input(s): CKTOTAL, CKMB, CKMBINDEX, TROPONINI in the last 168 hours. BNP (last 3 results) No results for input(s): PROBNP in the last 8760 hours. HbA1C: No results for input(s): HGBA1C in the last 72 hours. CBG: Recent Labs  Lab 05/09/21 2032  GLUCAP 262*   Lipid Profile: No results for input(s): CHOL, HDL, LDLCALC, TRIG, CHOLHDL, LDLDIRECT in the last 72 hours. Thyroid Function Tests: No results for input(s): TSH, T4TOTAL, FREET4, T3FREE, THYROIDAB in the last 72 hours. Anemia Panel: No results for input(s): VITAMINB12, FOLATE, FERRITIN, TIBC, IRON,  RETICCTPCT in the last 72 hours. Urine analysis:    Component Value Date/Time   COLORURINE YELLOW 05/09/2021 1912   APPEARANCEUR CLEAR 05/09/2021 1912   LABSPEC 1.019 05/09/2021 1912   PHURINE 5.0 05/09/2021 1912   GLUCOSEU NEGATIVE 05/09/2021 1912   HGBUR SMALL (A) 05/09/2021 1912   BILIRUBINUR NEGATIVE 05/09/2021 1912   BILIRUBINUR negative 02/05/2020 1117   KETONESUR NEGATIVE 05/09/2021 1912   PROTEINUR NEGATIVE 05/09/2021 1912   UROBILINOGEN 0.2 02/05/2020 1117   NITRITE NEGATIVE 05/09/2021 1912   LEUKOCYTESUR NEGATIVE 05/09/2021 1912    Radiological Exams on Admission: I have personally reviewed images DG Chest 2 View  Result Date: 05/09/2021 CLINICAL DATA:  Lays for 10 days, history of COPD EXAM: CHEST - 2 VIEW COMPARISON:  Chest radiograph 09/17/2019 FINDINGS: The cardiomediastinal silhouette is normal. There is no focal consolidation or pulmonary edema. There is no pleural effusion or pneumothorax. There is no acute osseous abnormality. IMPRESSION: No radiographic evidence of acute cardiopulmonary process. Electronically Signed   By: Lesia HausenPeter  Noone M.D.   On: 05/09/2021 15:16    EKG: I have personally reviewed EKG: Sinus tachycardia, nonspecific T wave abnormality.  Rate increased since prior tracing.  Assessment and  Plan: * Acute GI bleeding- (present on admission) Symptomatic acute blood loss anemia Patient reports history of hematochezia since last year but started having melena 2 weeks ago.  No hematemesis.  Endorsing daily heavy alcohol use and regular NSAID use.  Also takes aspirin 2-3 times a week.  Per chart review, he was having rectal bleeding back in October 2022 and PCP had referred him to GI.  Patient states he had an appointment in December but missed it as he had to travel abroad.  He is [place] and reports being told in the past that he had gastritis but never had an EGD done.  Work-up done at present showing hemoglobin 6.0, was 15.3 on labs done in March 2022.  FOBT positive.  He is tachycardic with heart rate in the 110s to 120s but blood pressure remains stable. -He received Protonix bolus in the ED, now on continuous infusion.  Receiving 1 unit PRBCs, follow-up posttransfusion H&H.  Transfuse if hemoglobin less than 7.  No aspirin or NSAIDs.  IV fluid hydration.  He will need EGD for further evaluation of melena/upper GI bleed but may also need colonoscopy given history of hematochezia in the past.  I have sent a message to GI (Dr. Leonides Schanzorsey) requesting consultation in the morning.  Keep n.p.o.  Leukocytosis- (present on admission) Likely reactive in the setting of anemia.  No obvious source of infection such as pneumonia or UTI.  COVID and influenza PCR negative.  He is tachycardic in the setting of acute GI bleed/anemia but not febrile. -Check procalcitonin level and monitor WBC count  Hyperglycemia- (present on admission) Random blood glucose in the 200s. -Check A1c  Alcohol use- (present on admission) Tachycardic at present but no other signs of withdrawal. -CIWA protocol; Ativan as needed.  Thiamine, folate, and multivitamin.  Dyslipidemia- (present on admission) -Continue statin  Chronic obstructive pulmonary disease ([facility])- (present on admission) No signs of acute  exacerbation.  Not on any inhalers at home. -DuoNeb as needed    DVT prophylaxis: SCDs Code Status: Full Code Family Communication: No family available at this time. Consults called: Gastroenterology Level of care: Telemetry bed Admission status: It is my clinical opinion that admission to INPATIENT is reasonable and necessary because of the expectation that this patient will require  hospital care that crosses at least 2 midnights to treat this condition based on the medical complexity of the problems presented.  Given the aforementioned information, the predictability of an adverse outcome is felt to be significant.   John Giovanni, MD [facility] 05/10/2021, 2:01 AM

## 2021-05-10 NOTE — Anesthesia Procedure Notes (Signed)
Procedure Name: MAC Date/Time: 05/10/2021 2:10 PM Performed by: Jenne Campus, CRNA Pre-anesthesia Checklist: Patient identified, Emergency Drugs available, Suction available, Patient being monitored and Timeout performed Oxygen Delivery Method: Nasal cannula

## 2021-05-10 NOTE — ED Notes (Addendum)
MD notified that pt is tachy in the 120s-130s - pt is resting in bed at this time - no further orders at this time

## 2021-05-10 NOTE — Progress Notes (Signed)
°  Progress Note   Patient: Scott Hanna GUR:[id number] DOB: Oct 08, 1965 DOA: 05/09/2021     1 DOS: the patient was seen and examined on 05/10/2021   Brief hospital course: 55 past medical history significant for COPD, rectal bleeding, thoracic aortic aneurysm, hyperlipidemia, anxiety who presents with generalized weakness, fatigue and shortness of breath.  He reports 2 weeks of melena.  He has been having on and off hematochezia.  He was found to have hemoglobin of 6.0.  FOBT positive.  GI consulted, plan for upper endoscopy today.   Assessment and Plan: * Acute GI bleeding- (present on admission) Symptomatic acute blood loss anemia. -Patient with a hemoglobin of 6.0, in the setting of melena and hematochezia.  -Currently receiving third unit of packed red blood cell. -Check hemoglobin every 8 hours. -Continue with Protonix drip. -GI consulted, plan for upper endoscopy today.  If endoscopy is negative recommend CTA followed by colon.   Hyponatremia In the setting of hypovolemia and GI bleed.  Continue with IV fluids  Alcohol use- (present on admission) - Continue with CIWA, thiamine and folic acid -No evidence of alcohol withdrawal currently. -Last alcoholic drink was Saturday.  No prior history of withdrawal. -He is determined to stop drinking alcohol.   Hyperglycemia- (present on admission) Random blood glucose in the 200s. - A1c Pending.  -Monitor CBG>   Leukocytosis- (present on admission) Likely reactive. COVID and influenza PCR negative. Chest  X ray: No radiographic evidence of acute cardiopulmonary process.  UA negative for infection. -procalcitoninless than 0.10.   Dyslipidemia- (present on admission) -Continue with  statin  Chronic obstructive pulmonary disease (HCC)- (present on admission) Stable, no evidence of acute exacerbation. -DuoNeb as needed         Subjective:  He reports 2 weeks history of melena, 2-3 episodes per days.  He presented with  weakness.  He reports hematochezia, on and off, especially see blood on tissue paper  Physical Exam: Vitals:   05/10/21 0930 05/10/21 0945 05/10/21 1045 05/10/21 1133  BP: 117/79 119/86 107/79 107/79  Pulse: 93 (!) 107 98 98  Resp: 14 17 16    Temp:      TempSrc:      SpO2: 100% 100% 99%   Weight:      Height:       General: No acute distress CVS: S1, S2 regular rhythm and rate Lungs: Clear to auscultation Abdomen: Bowel sounds present, soft nontender nondistended   Data Reviewed:  CBC, chest x-ray and UA reviewed.  Family Communication: Care discussed with patient.  Disposition: Status is: Inpatient Remains inpatient appropriate because: Acute blood loss anemia, requiring so far 3 units of packed red blood cell.  Undergoing GI work-up.          Planned Discharge Destination: Home     Time spent: 45 minutes  Author: , MD 05/10/2021 11:47 AM  For on call review [email or web address].

## 2021-05-10 NOTE — Subjective & Objective (Addendum)
Patient is a 56 year old Spanish-speaking male with a past medical history of COPD, rectal bleeding, thoracic aortic aneurysm, hyperlipidemia, anxiety.  He was seen at urgent care yesterday for fatigue and generalized weakness.  Blood work revealed low hemoglobin of 6.3 and he was sent to the ED for blood transfusion and further evaluation.  In the ED, patient was tachycardic but not hypotensive.  He endorsed melena in the setting of NSAID and frequent alcohol use.  Labs revealed WBC 17.1.  Hemoglobin 6.0.  FOBT positive.  Glucose 208.  UA without signs of infection.  High-sensitivity troponin negative.  COVID and influenza PCR negative.  Chest x-ray not suggestive of pneumonia. Patient was given IV Protonix bolus and started on continuous infusion.  1 unit PRBCs ordered.  Spanish interpreter services used.  Patient reports 2-week history of melena.  Reports lightheadedness, generalized weakness, and dyspnea for the past few days.  Denies hematemesis.  Does report history of hematochezia since last year and is PCP referred him to GI for colonoscopy.  Patient states he missed his GI appointment in December as he had to travel to his country a week before this appointment.  States he is from [place] and was told in the past by a physician over there that he had "gastritis" but never had endoscopy done.  Reports chronic alcohol use -2 to 3 cups of whiskey and rum every day.  He takes aspirin 2 or 3 times a week.  Also takes ibuprofen and diclofenac regularly for chronic pain/headaches.  Patient denies chest pain.  No other complaints.
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
## 2021-05-10 NOTE — Assessment & Plan Note (Addendum)
Likely reactive. COVID and influenza PCR negative. Chest  X ray: No radiographic evidence of acute cardiopulmonary process.  UA negative for infection. -procalcitoninless than 0.10.  Trending down.

## 2021-05-10 NOTE — Assessment & Plan Note (Addendum)
In the setting of hypovolemia and GI bleed.  Received IV fluids.

## 2021-05-10 NOTE — ED Notes (Signed)
Pt is resting. Pt vitals are WNL and pt has no concerns at this time.

## 2021-05-10 NOTE — Plan of Care (Signed)

## 2021-05-10 NOTE — Assessment & Plan Note (Addendum)
-   Continue with CIWA, thiamine and folic acid -No evidence of alcohol withdrawal currently. -Last alcoholic drink was Saturday.  No prior history of withdrawal. -He is determined to stop drinking alcohol.

## 2021-05-10 NOTE — Assessment & Plan Note (Addendum)
Random blood glucose in the 200s. 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  -Monitor CBG>

## 2021-05-10 NOTE — Transfer of Care (Signed)
Immediate Anesthesia Transfer of Care Note  Patient: Isak Sotomayor  Procedure(s) Performed: ESOPHAGOGASTRODUODENOSCOPY (EGD) WITH PROPOFOL  Patient Location: Endoscopy Unit  Anesthesia Type:MAC  Level of Consciousness: awake, oriented and patient cooperative  Airway & Oxygen Therapy: Patient Spontanous Breathing and Patient connected to nasal cannula oxygen  Post-op Assessment: Report given to RN and Post -op Vital signs reviewed and stable  Post vital signs: Reviewed  Last Vitals:  Vitals Value Taken Time  BP    Temp    Pulse 92 05/10/21 1443  Resp 21 05/10/21 1443  SpO2 100 % 05/10/21 1443  Vitals shown include unvalidated device data.  Last Pain:  Vitals:   05/10/21 1336  TempSrc:   PainSc: 0-No pain         Complications: No notable events documented.

## 2021-05-10 NOTE — Hospital Course (Addendum)
55 past medical history significant for COPD, rectal bleeding, thoracic aortic aneurysm, hyperlipidemia, anxiety who presents with generalized weakness, fatigue and shortness of breath.  He reports 2 weeks of melena.  He has been having on and off hematochezia.  He was found to have hemoglobin of 6.0.  FOBT positive.  GI consulted, underwent upper endoscopy showed; gastric and duodenal ulcer clean base. Underwent colonoscopy 2/22; three 6 to 8 mm polyps removes, diverticulosis, non bleeding internal hemorrhoids.

## 2021-05-10 NOTE — Assessment & Plan Note (Addendum)
Stable, no evidence of acute exacerbation. -DuoNeb as needed

## 2021-05-10 NOTE — ED Notes (Signed)
MD Rathore asked this RN to page floor coverage - MD Julian Reil paged and notified of critical hgb

## 2021-05-10 NOTE — ED Notes (Signed)
Dr. Rathore at bedside 

## 2021-05-10 NOTE — Anesthesia Postprocedure Evaluation (Signed)
Anesthesia Post Note  Patient: Scott Hanna  Procedure(s) Performed: ESOPHAGOGASTRODUODENOSCOPY (EGD) WITH PROPOFOL     Patient location during evaluation: PACU Anesthesia Type: MAC Level of consciousness: awake and alert Pain management: pain level controlled Vital Signs Assessment: post-procedure vital signs reviewed and stable Respiratory status: spontaneous breathing, nonlabored ventilation, respiratory function stable and patient connected to nasal cannula oxygen Cardiovascular status: stable and blood pressure returned to baseline Postop Assessment: no apparent nausea or vomiting Anesthetic complications: no   No notable events documented.  Last Vitals:  Vitals:   05/10/21 1518 05/10/21 1541  BP: 106/86 126/88  Pulse: 90 86  Resp: 15 20  Temp:  36.7 C  SpO2: 100% 94%    Last Pain:  Vitals:   05/10/21 1541  TempSrc: Oral  PainSc:                  Effie Berkshire

## 2021-05-10 NOTE — Anesthesia Preprocedure Evaluation (Addendum)
Anesthesia Evaluation  Patient identified by MRN, date of birth, ID band Patient awake    Reviewed: Allergy & Precautions, NPO status , Patient's Chart, lab work & pertinent test results  Airway Mallampati: I  TM Distance: >3 FB Neck ROM: Full    Dental  (+) Chipped,    Pulmonary COPD, former smoker,    breath sounds clear to auscultation       Cardiovascular negative cardio ROS   Rhythm:Regular Rate:Normal     Neuro/Psych Anxiety negative neurological ROS     GI/Hepatic negative GI ROS, Neg liver ROS,   Endo/Other  negative endocrine ROS  Renal/GU negative Renal ROS     Musculoskeletal negative musculoskeletal ROS (+)   Abdominal Normal abdominal exam  (+)   Peds  Hematology negative hematology ROS (+)   Anesthesia Other Findings   Reproductive/Obstetrics                            Anesthesia Physical Anesthesia Plan  ASA: 3  Anesthesia Plan: MAC   Post-op Pain Management:    Induction: Intravenous  PONV Risk Score and Plan: 0 and Propofol infusion  Airway Management Planned: Natural Airway and Simple Face Mask  Additional Equipment: None  Intra-op Plan:   Post-operative Plan:   Informed Consent: I have reviewed the patients History and Physical, chart, labs and discussed the procedure including the risks, benefits and alternatives for the proposed anesthesia with the patient or authorized representative who has indicated his/her understanding and acceptance.     Interpreter used for [employer] Discussed with: CRNA  Anesthesia Plan Comments: (Lab Results      Component                Value               Date                      WBC                      14.9 (H)            05/10/2021                HGB                      7.8 (L)             05/10/2021                HCT                      22.9 (L)            05/10/2021                MCV                       91.0                05/10/2021                PLT                      237                 05/10/2021           )  Anesthesia Quick Evaluation

## 2021-05-10 NOTE — Assessment & Plan Note (Addendum)
Symptomatic acute blood loss anemia. -Patient with a hemoglobin of 6.0, in the setting of melena and hematochezia.  -Received 3 units PRBC during this hospitalization. Hb stable 7.8--7.3 -Continue with Protonix  BID for 4 weeks them daily.  -GI consulted, underwent endoscopy showed gastric and duodenal ulcer, clean base. Biopsy pending.  Anemia panel normal.  Underwent colonoscopy: S/P three polypectomy, mild pan-colonic diverticulosis, non bleeding internal hemorrhoids.  CT abdomen pelvis negative.  He received IV iron, ferrous sulfate prescription was provided, he will start taking oral iron in 3-5 days,.to avoid confusion with melena.

## 2021-05-10 NOTE — ED Notes (Signed)
MD Rathore paged and notified of critical hbg level
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
## 2021-05-10 NOTE — Progress Notes (Signed)
Inpatient Diabetes Program Recommendations  AACE/ADA: New Consensus Statement on Inpatient Glycemic Control (2015)  Target Ranges:  Prepandial:   less than 140 mg/dL      Peak postprandial:   less than 180 mg/dL (1-2 hours)      Critically ill patients:  140 - 180 mg/dL   Lab Results  Component Value Date   GLUCAP 262 (H) 05/09/2021   HGBA1C 6.1 (H) 05/26/2020    Review of Glycemic Control  Latest Reference Range & Units 05/09/21 20:32  Glucose-Capillary 70 - 99 mg/dL 262 (H)   Diabetes history: None Outpatient Diabetes medications: None Current orders for Inpatient glycemic control: None  Inpatient Diabetes Program Recommendations:    A1C ordered, however doubt it will be accurate due too anemia.  May consider adding Novolog very sensitive (0-6 units) q 4 hours.    Thanks,  Adah Perl, RN, BC-ADM Inpatient Diabetes Coordinator Pager [phone]  (8a-5p)

## 2021-05-10 NOTE — ED Notes (Signed)
Pt labs can be collected at 345am - 2hrs post transfusion completion

## 2021-05-10 NOTE — Assessment & Plan Note (Addendum)
Continue with statin. 

## 2021-05-11 ENCOUNTER — Inpatient Hospital Stay (HOSPITAL_COMMUNITY): Payer: BC Managed Care – PPO

## 2021-05-11 ENCOUNTER — Encounter (HOSPITAL_COMMUNITY): Payer: Self-pay | Admitting: Gastroenterology

## 2021-05-11 ENCOUNTER — Inpatient Hospital Stay (HOSPITAL_COMMUNITY): Payer: BC Managed Care – PPO | Admitting: Certified Registered"

## 2021-05-11 ENCOUNTER — Encounter (HOSPITAL_COMMUNITY)
Admission: EM | Disposition: A | Discharge status: Home / Self Care | Payer: Self-pay | Source: Home / Self Care | Attending: Internal Medicine

## 2021-05-11 DIAGNOSIS — K635 Polyp of colon: Secondary | ICD-10-CM

## 2021-05-11 DIAGNOSIS — K922 Gastrointestinal hemorrhage, unspecified: Secondary | ICD-10-CM | POA: Diagnosis not present

## 2021-05-11 HISTORY — PX: COLONOSCOPY WITH PROPOFOL: SHX5780

## 2021-05-11 HISTORY — PX: POLYPECTOMY: SHX5525

## 2021-05-11 LAB — BPAM RBC
Blood Product Expiration Date: 202302242359
Blood Product Expiration Date: 202302282359
Blood Product Expiration Date: 202303032359
ISSUE DATE / TIME: 202302202231
ISSUE DATE / TIME: 202302210613
ISSUE DATE / TIME: 202302210811
Unit Type and Rh: 7300
Unit Type and Rh: 7300
Unit Type and Rh: 7300

## 2021-05-11 LAB — IRON AND TIBC
Iron: 46 ug/dL (ref 45–182)
Saturation Ratios: 13 % — ABNORMAL LOW (ref 17.9–39.5)
TIBC: 349 ug/dL (ref 250–450)
UIBC: 303 ug/dL

## 2021-05-11 LAB — CBC
HCT: 22.6 % — ABNORMAL LOW (ref 39.0–52.0)
Hemoglobin: 7.3 g/dL — ABNORMAL LOW (ref 13.0–17.0)
MCH: 29.6 pg (ref 26.0–34.0)
MCHC: 32.3 g/dL (ref 30.0–36.0)
MCV: 91.5 fL (ref 80.0–100.0)
Platelets: 241 10*3/uL (ref 150–400)
RBC: 2.47 MIL/uL — ABNORMAL LOW (ref 4.22–5.81)
RDW: 16 % — ABNORMAL HIGH (ref 11.5–15.5)
WBC: 12.2 10*3/uL — ABNORMAL HIGH (ref 4.0–10.5)
nRBC: 0.2 % (ref 0.0–0.2)

## 2021-05-11 LAB — TYPE AND SCREEN
ABO/RH(D): B POS
Antibody Screen: NEGATIVE
Unit division: 0
Unit division: 0
Unit division: 0

## 2021-05-11 LAB — FOLATE: Folate: 12.8 ng/mL (ref >5.9)

## 2021-05-11 LAB — HEMOGLOBIN AND HEMATOCRIT, BLOOD
HCT: 24.8 % — ABNORMAL LOW (ref 39.0–52.0)
Hemoglobin: 7.8 g/dL — ABNORMAL LOW (ref 13.0–17.0)

## 2021-05-11 LAB — RETICULOCYTES
Immature Retic Fract: 49.7 % — ABNORMAL HIGH (ref 2.3–15.9)
RBC.: 2.44 MIL/uL — ABNORMAL LOW (ref 4.22–5.81)
Retic Count, Absolute: 205 10*3/uL — ABNORMAL HIGH (ref 19.0–186.0)
Retic Ct Pct: 8.4 % — ABNORMAL HIGH (ref 0.4–3.1)

## 2021-05-11 LAB — VITAMIN B12: Vitamin B-12: 425 pg/mL (ref 180–914)

## 2021-05-11 LAB — GLUCOSE, CAPILLARY
Glucose-Capillary: 101 mg/dL — ABNORMAL HIGH (ref 70–99)
Glucose-Capillary: 112 mg/dL — ABNORMAL HIGH (ref 70–99)
Glucose-Capillary: 162 mg/dL — ABNORMAL HIGH (ref 70–99)
Glucose-Capillary: 93 mg/dL (ref 70–99)

## 2021-05-11 LAB — FERRITIN: Ferritin: 26 ng/mL (ref 24–336)

## 2021-05-11 LAB — HEMOGLOBIN A1C
Hgb A1c MFr Bld: 5.7 % — ABNORMAL HIGH (ref 4.8–5.6)
Mean Plasma Glucose: 117 mg/dL

## 2021-05-11 SURGERY — COLONOSCOPY WITH PROPOFOL
Anesthesia: Monitor Anesthesia Care

## 2021-05-11 MED ORDER — ACETAMINOPHEN 10 MG/ML IV SOLN
1000.0000 mg | Freq: Once | INTRAVENOUS | Status: AC
  Filled 2021-05-11: qty 100

## 2021-05-11 MED ORDER — IOHEXOL 350 MG/ML SOLN
80.0000 mL | Freq: Once | INTRAVENOUS | Status: AC | PRN
  Administered 2021-05-11: 80 mL via INTRAVENOUS

## 2021-05-11 MED ORDER — PANTOPRAZOLE SODIUM 40 MG IV SOLR: 40.0000 mg | Freq: Two times a day (BID) | INTRAVENOUS | Status: DC

## 2021-05-11 MED ORDER — PHENYLEPHRINE 40 MCG/ML (10ML) SYRINGE FOR IV PUSH (FOR BLOOD PRESSURE SUPPORT)
PREFILLED_SYRINGE | INTRAVENOUS | Status: DC | PRN
  Administered 2021-05-11 (×2): 80 ug via INTRAVENOUS

## 2021-05-11 MED ORDER — CYANOCOBALAMIN 500 MCG PO TABS
250.0000 ug | ORAL_TABLET | Freq: Every day | ORAL | Status: DC
  Administered 2021-05-11 – 2021-05-12 (×2): 250 ug via ORAL
  Filled 2021-05-11 (×4): qty 1

## 2021-05-11 MED ORDER — ONDANSETRON HCL 4 MG/2ML IJ SOLN: 4.0000 mg | Freq: Once | INTRAMUSCULAR | Status: DC | PRN

## 2021-05-11 MED ORDER — IOHEXOL 9 MG/ML PO SOLN
ORAL | Status: AC
  Administered 2021-05-11: 500 mL
  Filled 2021-05-11: qty 1000

## 2021-05-11 MED ORDER — PANTOPRAZOLE SODIUM 40 MG PO TBEC
40.0000 mg | DELAYED_RELEASE_TABLET | Freq: Two times a day (BID) | ORAL | Status: DC
  Administered 2021-05-11 – 2021-05-12 (×2): 40 mg via ORAL
  Filled 2021-05-11 (×2): qty 1

## 2021-05-11 MED ORDER — PROPOFOL 500 MG/50ML IV EMUL
INTRAVENOUS | Status: DC | PRN
  Administered 2021-05-11: 125 ug/kg/min via INTRAVENOUS

## 2021-05-11 MED ORDER — LIDOCAINE 2% (20 MG/ML) 5 ML SYRINGE
INTRAMUSCULAR | Status: DC | PRN
  Administered 2021-05-11: 100 mg via INTRAVENOUS

## 2021-05-11 MED ORDER — AMISULPRIDE (ANTIEMETIC) 5 MG/2ML IV SOLN
10.0000 mg | Freq: Once | INTRAVENOUS | Status: DC | PRN
  Filled 2021-05-11: qty 4

## 2021-05-11 MED ORDER — PROPOFOL 10 MG/ML IV BOLUS
INTRAVENOUS | Status: DC | PRN
  Administered 2021-05-11 (×4): 20 mg via INTRAVENOUS

## 2021-05-11 SURGICAL SUPPLY — 22 items

## 2021-05-11 NOTE — Op Note (Signed)
[facility] Patient Name: Scott Hanna Procedure Date : 05/11/2021 MRN: [id number] Attending MD: Lynann Bologna , MD Date of Birth: 03-27-1965 CSN: [id number] Age: 56 Admit Type: Inpatient Procedure:                Colonoscopy Indications:              Heme positive stool Hb 15.3 (05/2020) to 6.3 s/p 3U.                            R/O colonic lesions. Providers:                Lynann Bologna, MD, Willy Eddy, RN, Michele Mcalpine                            Technician Referring MD:              Medicines:                Monitored Anesthesia Care Complications:            No immediate complications. Estimated Blood Loss:     Estimated blood loss: none. Procedure:                Pre-Anesthesia Assessment:                           - Prior to the procedure, a History and Physical                            was performed, and patient medications and                            allergies were reviewed. The patient's tolerance of                            previous anesthesia was also reviewed. The risks                            and benefits of the procedure and the sedation                            options and risks were discussed with the patient.                            All questions were answered, and informed consent                            was obtained. Prior Anticoagulants: The patient has                            taken no previous anticoagulant or antiplatelet                            agents. ASA Grade Assessment: II - A patient with  mild systemic disease. After reviewing the risks                            and benefits, the patient was deemed in                            satisfactory condition to undergo the procedure.                           After obtaining informed consent, the colonoscope                            was passed under direct vision. Throughout the                            procedure, the patient's blood  pressure, pulse, and                            oxygen saturations were monitored continuously. The                            [id number] ([id number] Olympus coloscope was                            introduced through the anus and advanced to the 2                            cm into the ileum. The colonoscopy was performed                            without difficulty. The patient tolerated the                            procedure well. The quality of the bowel                            preparation was good. The terminal ileum, ileocecal                            valve, appendiceal orifice, and rectum were                            photographed. Scope In: 10:23:53 AM Scope Out: 10:45:41 AM Scope Withdrawal Time: 0 hours 14 minutes 47 seconds  Total Procedure Duration: 0 hours 21 minutes 48 seconds  Findings:      Three sessile polyps were found in the distal sigmoid colon, proximal       transverse colon and proximal ascending colon. The polyps were 6 to 8 mm       in size. These polyps were removed with a cold snare. Resection and       retrieval were complete. Estimated blood loss: none.      A few medium-mouthed diverticula were found in the sigmoid colon and       ascending colon.      Non-bleeding internal hemorrhoids  were found during retroflexion. The       hemorrhoids were moderate and Grade I (internal hemorrhoids that do not       prolapse).      The terminal ileum appeared normal.      The exam was otherwise without abnormality on direct and retroflexion       views. Impression:               - Three 6 to 8 mm polyps in the distal sigmoid                            colon, in the proximal transverse colon and in the                            proximal ascending colon, removed with a cold                            snare. Resected and retrieved.                           - Mild pancolonic diverticulosis.                           - Non-bleeding internal hemorrhoids.                            - The examined portion of the ileum was normal.                           - The examination was otherwise normal on direct                            and retroflexion views. Recommendation:           - Return patient to hospital ward for ongoing care.                           - Resume previous diet.                           - Continue present medications.                           - Await pathology results.                           - Proceed with CT Abdo/pelvis                           - Repeat colonoscopy for surveillance based on                            pathology results.                           - Return to GI clinic in 6-8 weeks. Procedure Code(s):        --- Professional ---  69629, Colonoscopy, flexible; with removal of                            tumor(s), polyp(s), or other lesion(s) by snare                            technique Diagnosis Code(s):        --- Professional ---                           K63.5, Polyp of colon                           K64.0, First degree hemorrhoids                           R19.5, Other fecal abnormalities                           K57.30, Diverticulosis of large intestine without                            perforation or abscess without bleeding CPT copyright 2019 American Medical Association. All rights reserved. The codes documented in this report are preliminary and upon coder review may  be revised to meet current compliance requirements. Lynann Bologna, MD 05/11/2021 11:02:39 AM This report has been signed electronically. Number of Addenda: 0

## 2021-05-11 NOTE — Anesthesia Preprocedure Evaluation (Signed)
Anesthesia Evaluation  Patient identified by MRN, date of birth, ID band Patient awake    Reviewed: Allergy & Precautions, NPO status , Patient's Chart, lab work & pertinent test results  Airway Mallampati: I  TM Distance: >3 FB Neck ROM: Full    Dental  (+) Chipped,    Pulmonary COPD, former smoker,    breath sounds clear to auscultation       Cardiovascular Normal cardiovascular exam Rhythm:Regular Rate:Normal  Hx/o Thoracic Aortic aneurysm   Neuro/Psych Anxiety negative neurological ROS     GI/Hepatic Neg liver ROS, Melena  GI bleed   Endo/Other  Hyperlipidemia  Renal/GU negative Renal ROS  negative genitourinary   Musculoskeletal negative musculoskeletal ROS (+)   Abdominal Normal abdominal exam  (+)   Peds  Hematology  (+) Blood dyscrasia, anemia ,   Anesthesia Other Findings   Reproductive/Obstetrics                             Anesthesia Physical  Anesthesia Plan  ASA: 3  Anesthesia Plan: MAC   Post-op Pain Management:    Induction: Intravenous  PONV Risk Score and Plan: 0 and Propofol infusion  Airway Management Planned: Natural Airway and Simple Face Mask  Additional Equipment: None  Intra-op Plan:   Post-operative Plan:   Informed Consent: I have reviewed the patients History and Physical, chart, labs and discussed the procedure including the risks, benefits and alternatives for the proposed anesthesia with the patient or authorized representative who has indicated his/her understanding and acceptance.     Interpreter used for [employer] and Engineer, production given  Plan Discussed with: CRNA and Anesthesiologist  Anesthesia Plan Comments:         Anesthesia Quick Evaluation

## 2021-05-11 NOTE — Progress Notes (Signed)
°  Progress Note   Patient: Scott Hanna MEQ:[id number] DOB: [birth year] DOA: 05/09/2021     2 DOS: the patient was seen and examined on 05/11/2021   Brief hospital course: 55 past medical history significant for COPD, rectal bleeding, thoracic aortic aneurysm, hyperlipidemia, anxiety who presents with generalized weakness, fatigue and shortness of breath.  He reports 2 weeks of melena.  He has been having on and off hematochezia.  He was found to have hemoglobin of 6.0.  FOBT positive.  GI consulted, underwent upper endoscopy showed; gastric and duodenal ulcer clean base. Underwent colonoscopy 2/22; three 6 to 8 mm polyps removes, diverticulosis, non bleeding internal hemorrhoids.   Assessment and Plan: * Acute GI bleeding- (present on admission) Symptomatic acute blood loss anemia. -Patient with a hemoglobin of 6.0, in the setting of melena and hematochezia.  -Received 3 units PRBC during this hospitalization. Hb stable 7.8 -Continue with Protonix  BID for 4 weeks them daily.  -GI consulted, underwent endoscopy showed gastric and duodenal ulcer, clean base. Biopsy pending.  Anemia panel normal.  Underwent colonoscopy: S/P three polypectomy, mild pan-colonic diverticulosis, non bleeding internal hemorrhoids.  CT abdomen pelvis order.   Hyponatremia In the setting of hypovolemia and GI bleed.  Continue with IV fluids  Alcohol use- (present on admission) - Continue with CIWA, thiamine and folic acid -No evidence of alcohol withdrawal currently. -Last alcoholic drink was Saturday.  No prior history of withdrawal. -He is determined to stop drinking alcohol.   Hyperglycemia- (present on admission) Random blood glucose in the 200s. 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  -Monitor CBG>   Leukocytosis- (present on admission) Likely reactive. COVID and influenza PCR negative. Chest  X ray: No radiographic evidence of acute cardiopulmonary process.  UA negative for  infection. -procalcitoninless than 0.10.  Trending down.   Dyslipidemia- (present on admission) -Continue with  statin  Chronic obstructive pulmonary disease (HCC)- (present on admission) Stable, no evidence of acute exacerbation. -DuoNeb as needed         Subjective: he report two dark BM last night after prep then stool was yellow.   Physical Exam: Vitals:   05/11/21 1110 05/11/21 1120 05/11/21 1200 05/11/21 1524  BP: 117/81  122/84 116/82  Pulse: 98 87 93 95  Resp: 13 14 14 16   Temp:  97.8 F (36.6 C) 98.3 F (36.8 C) 97.7 F (36.5 C)  TempSrc:   Oral Oral  SpO2: 100% 100% 100%   Weight:      Height:       General; NAD Lung:  CTA Abdomen; soft, nt  Data Reviewed:  Cbc, anemia panel.   Family Communication: care discussed with patient.   Disposition: Status is: Inpatient Remains inpatient appropriate because: management of GI bleed.           Planned Discharge Destination: Home     Time spent: 45 minutes  Author: , MD 05/11/2021 6:21 PM  For on call review [email or web address].

## 2021-05-11 NOTE — Anesthesia Procedure Notes (Signed)
Procedure Name: MAC Date/Time: 05/11/2021 10:13 AM Performed by: Amadeo Garnet, CRNA Pre-anesthesia Checklist: Patient identified, Emergency Drugs available, Suction available and Patient being monitored Patient Re-evaluated:Patient Re-evaluated prior to induction Oxygen Delivery Method: Simple face mask Preoxygenation: Pre-oxygenation with 100% oxygen Induction Type: IV induction Placement Confirmation: CO2 detector Dental Injury: Teeth and Oropharynx as per pre-operative assessment

## 2021-05-11 NOTE — Transfer of Care (Signed)
Immediate Anesthesia Transfer of Care Note  Patient: Scott Hanna  Procedure(s) Performed: COLONOSCOPY WITH PROPOFOL POLYPECTOMY  Patient Location: PACU  Anesthesia Type:MAC  Level of Consciousness: drowsy and patient cooperative  Airway & Oxygen Therapy: Patient Spontanous Breathing  Post-op Assessment: Report given to RN, Post -op Vital signs reviewed and stable and Patient moving all extremities  Post vital signs: Reviewed and stable  Last Vitals:  Vitals Value Taken Time  BP 105/67 05/11/21 1056  Temp    Pulse 97 05/11/21 1057  Resp 16 05/11/21 1057  SpO2 100 % 05/11/21 1057  Vitals shown include unvalidated device data.  Last Pain:  Vitals:   05/11/21 0938  TempSrc: Temporal  PainSc: 0-No pain         Complications: No notable events documented.

## 2021-05-11 NOTE — Interval H&P Note (Signed)
History and Physical Interval Note:  05/11/2021 9:55 AM  Scott Hanna  has presented today for surgery, with the diagnosis of melena, GI bleed.  The various methods of treatment have been discussed with the patient and family. After consideration of risks, benefits and other options for treatment, the patient has consented to  Procedure(s): COLONOSCOPY WITH PROPOFOL (N/A) as a surgical intervention.  The patient's history has been reviewed, patient examined, no change in status, stable for surgery.  I have reviewed the patient's chart and labs.  Questions were answered to the patient's satisfaction.     [name]

## 2021-05-11 NOTE — Anesthesia Postprocedure Evaluation (Signed)
Anesthesia Post Note  Patient: Scott Hanna  Procedure(s) Performed: COLONOSCOPY WITH PROPOFOL POLYPECTOMY     Patient location during evaluation: PACU Anesthesia Type: MAC Level of consciousness: awake and alert and oriented Pain management: pain level controlled Vital Signs Assessment: post-procedure vital signs reviewed and stable Respiratory status: spontaneous breathing, nonlabored ventilation and respiratory function stable Cardiovascular status: stable and blood pressure returned to baseline Postop Assessment: no apparent nausea or vomiting Anesthetic complications: no   No notable events documented.  Last Vitals:  Vitals:   05/11/21 1120 05/11/21 1200  BP:  122/84  Pulse: 87 93  Resp: 14 14  Temp: 36.6 C 36.8 C  SpO2: 100% 100%    Last Pain:  Vitals:   05/11/21 1200  TempSrc: Oral  PainSc: 0-No pain                 Sena Hoopingarner A.

## 2021-05-12 ENCOUNTER — Encounter (HOSPITAL_COMMUNITY): Payer: Self-pay | Admitting: Gastroenterology

## 2021-05-12 ENCOUNTER — Other Ambulatory Visit (HOSPITAL_COMMUNITY): Payer: Self-pay

## 2021-05-12 LAB — SURGICAL PATHOLOGY

## 2021-05-12 LAB — CBC
HCT: 21.7 % — ABNORMAL LOW (ref 39.0–52.0)
Hemoglobin: 7.3 g/dL — ABNORMAL LOW (ref 13.0–17.0)
MCH: 30.4 pg (ref 26.0–34.0)
MCHC: 33.6 g/dL (ref 30.0–36.0)
MCV: 90.4 fL (ref 80.0–100.0)
Platelets: 260 10*3/uL (ref 150–400)
RBC: 2.4 MIL/uL — ABNORMAL LOW (ref 4.22–5.81)
RDW: 16.4 % — ABNORMAL HIGH (ref 11.5–15.5)
WBC: 11.3 10*3/uL — ABNORMAL HIGH (ref 4.0–10.5)
nRBC: 0.4 % — ABNORMAL HIGH (ref 0.0–0.2)

## 2021-05-12 LAB — GLUCOSE, CAPILLARY
Glucose-Capillary: 112 mg/dL — ABNORMAL HIGH (ref 70–99)
Glucose-Capillary: 114 mg/dL — ABNORMAL HIGH (ref 70–99)
Glucose-Capillary: 150 mg/dL — ABNORMAL HIGH (ref 70–99)

## 2021-05-12 MED ORDER — CYANOCOBALAMIN 250 MCG PO TABS
250.0000 ug | ORAL_TABLET | Freq: Every day | ORAL | 0 refills | Status: AC
  Filled 2021-05-12: qty 30, 30d supply, fill #0

## 2021-05-12 MED ORDER — PANTOPRAZOLE SODIUM 40 MG PO TBEC
40.0000 mg | DELAYED_RELEASE_TABLET | Freq: Two times a day (BID) | ORAL | 2 refills | Status: AC
  Filled 2021-05-12: qty 30, 15d supply, fill #0

## 2021-05-12 MED ORDER — SODIUM CHLORIDE 0.9 % IV SOLN
250.0000 mg | Freq: Once | INTRAVENOUS | Status: AC
  Administered 2021-05-12: 250 mg via INTRAVENOUS
  Filled 2021-05-12: qty 20

## 2021-05-12 MED ORDER — CERTAVITE/ANTIOXIDANTS PO TABS
1.0000 | ORAL_TABLET | Freq: Every day | ORAL | 0 refills | Status: AC
  Filled 2021-05-12: qty 30, 30d supply, fill #0

## 2021-05-12 MED ORDER — FERROUS SULFATE 325 (65 FE) MG PO TABS
325.0000 mg | ORAL_TABLET | Freq: Every day | ORAL | 3 refills | Status: AC
  Filled 2021-05-12: qty 30, 30d supply, fill #0

## 2021-05-12 NOTE — Discharge Summary (Incomplete)
Physician Discharge Summary   Patient: Scott Hanna MRN: [id number] DOB: 1965/11/15  Admit date:     05/09/2021  Discharge date: {dischdate:26783}  Discharge Physician: Alba Cory   PCP: Georgina Quint, MD   Recommendations at discharge:  {Tip this will not be part of the note when signed- Example include specific recommendations for outpatient follow-up, pending tests to follow-up on. (Optional):26781}  ***  Discharge Diagnoses: Principal Problem:   Acute GI bleeding Active Problems:   Chronic obstructive pulmonary disease (HCC)   Dyslipidemia   Leukocytosis   Hyperglycemia   Alcohol use   Hyponatremia  Resolved Problems:   * No resolved hospital problems. [facility] Course: 56 past medical history significant for COPD, rectal bleeding, thoracic aortic aneurysm, hyperlipidemia, anxiety who presents with generalized weakness, fatigue and shortness of breath.  He reports 2 weeks of melena.  He has been having on and off hematochezia.  He was found to have hemoglobin of 6.0.  FOBT positive.  GI consulted, underwent upper endoscopy showed; gastric and duodenal ulcer clean base. Underwent colonoscopy 2/22; three 6 to 8 mm polyps removes, diverticulosis, non bleeding internal hemorrhoids.   Assessment and Plan: * Acute GI bleeding- (present on admission) Symptomatic acute blood loss anemia. -Patient with a hemoglobin of 6.0, in the setting of melena and hematochezia.  -Received 3 units PRBC during this hospitalization. Hb stable 7.8 -Continue with Protonix  BID for 4 weeks them daily.  -GI consulted, underwent endoscopy showed gastric and duodenal ulcer, clean base. Biopsy pending.  Anemia panel normal.  Underwent colonoscopy: S/P three polypectomy, mild pan-colonic diverticulosis, non bleeding internal hemorrhoids.  CT abdomen pelvis order.   Hyponatremia In the setting of hypovolemia and GI bleed.  Continue with IV fluids  Alcohol use- (present on  admission) - Continue with CIWA, thiamine and folic acid -No evidence of alcohol withdrawal currently. -Last alcoholic drink was Saturday.  No prior history of withdrawal. -He is determined to stop drinking alcohol.   Hyperglycemia- (present on admission) Random blood glucose in the 200s. 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  -Monitor CBG>   Leukocytosis- (present on admission) Likely reactive. COVID and influenza PCR negative. Chest  X ray: No radiographic evidence of acute cardiopulmonary process.  UA negative for infection. -procalcitoninless than 0.10.  Trending down.   Dyslipidemia- (present on admission) -Continue with  statin  Chronic obstructive pulmonary disease (HCC)- (present on admission) Stable, no evidence of acute exacerbation. -DuoNeb as needed        {Tip this will not be part of the note when signed Body mass index is 32.55 kg/m. , ,  (Optional):26781}  {(NOTE) Pain control PDMP Statment (Optional):26782}  Consultants: *** Procedures performed: ***  Disposition: {Plan; Disposition:26390} Diet recommendation:  Discharge Diet Orders (From admission, onward)     Start     Ordered   05/12/21 0000  Diet - low sodium heart healthy        05/12/21 1132           {Diet_Plan:26776}  DISCHARGE MEDICATION: Allergies as of 05/12/2021   Not on File      Medication List     STOP taking these medications    Aspirin 81 81 MG EC tablet Generic drug: aspirin   buPROPion 150 MG 12 hr tablet Commonly known as: WELLBUTRIN SR   guaiFENesin 600 MG 12 hr tablet Commonly known as: Mucinex   meclizine 25 MG tablet Commonly known as: [employer]  TAKE these medications    acetaminophen 500 MG tablet Commonly known as: TYLENOL Take 1,000 mg by mouth every 6 (six) hours as needed for moderate pain or headache.   ferrous sulfate 325 (65 FE) MG tablet Take 1 tablet (325 mg total) by mouth daily.   MELATONIN GUMMIES PO Take 20 mg  by mouth at bedtime as needed (sleep).   multivitamin with minerals Tabs tablet Take 1 tablet by mouth daily. Start taking on: May 13, 2021   pantoprazole 40 MG tablet Commonly known as: PROTONIX Take 1 tablet (40 mg total) by mouth 2 (two) times daily.   rosuvastatin 20 MG tablet Commonly known as: Crestor Take 1 tablet (20 mg total) by mouth daily.   traZODone 50 MG tablet Commonly known as: DESYREL Take 0.5-1 tablets (25-50 mg total) by mouth at bedtime as needed for sleep. What changed:  how much to take when to take this   vitamin B-12 250 MCG tablet Commonly known as: CYANOCOBALAMIN Take 1 tablet (250 mcg total) by mouth daily. Start taking on: May 13, 2021        Follow-up Information     Sagardia, Eilleen KempfMiguel Jose, MD Follow up.   Specialty: Internal Medicine Why: necesitas, una cita con el medico en 3-4 dias para laboratoris, necesitas CBC para chequear hemoglobina. Contact information: [address] [place] [address] [phone]         Lynann BolognaGupta, Rajesh, MD Follow up in 12 week(s).   Specialties: Gastroenterology, Internal Medicine Why: necesitas citas para endoscopia, para darle seguimient a las ulceras del stomago. Contact information: [address] [place] [phone]                 Discharge Exam: Ceasar MonsFiled Weights   05/09/21 1900  Weight: 108.9 kg   ***  Condition at discharge: {DC Condition:26389}  The results of significant diagnostics from this hospitalization (including imaging, microbiology, ancillary and laboratory) are listed below for reference.   Imaging Studies: DG Chest 2 View  Result Date: 05/09/2021 CLINICAL DATA:  Lays for 10 days, history of COPD EXAM: CHEST - 2 VIEW COMPARISON:  Chest radiograph 09/17/2019 FINDINGS: The cardiomediastinal silhouette is normal. There is no focal consolidation or pulmonary edema. There is no pleural effusion or pneumothorax. There is no  acute osseous abnormality. IMPRESSION: No radiographic evidence of acute cardiopulmonary process. Electronically Signed   By: Lesia HausenPeter  Noone M.D.   On: 05/09/2021 15:16   CT ABDOMEN PELVIS W CONTRAST  Result Date: 05/11/2021 CLINICAL DATA:  GI hemorrhage with anemia, initial encounter EXAM: CT ABDOMEN AND PELVIS WITH CONTRAST TECHNIQUE: Multidetector CT imaging of the abdomen and pelvis was performed using the standard protocol following bolus administration of intravenous contrast. RADIATION DOSE REDUCTION: This exam was performed according to the departmental dose-optimization program which includes automated exposure control, adjustment of the mA and/or kV according to patient size and/or use of iterative reconstruction technique. CONTRAST:  80mL OMNIPAQUE IOHEXOL 350 MG/ML SOLN COMPARISON:  None. FINDINGS: Lower chest: No acute abnormality. Hepatobiliary: No focal liver abnormality is seen. No gallstones, gallbladder wall thickening, or biliary dilatation. Pancreas: Unremarkable. No pancreatic ductal dilatation or surrounding inflammatory changes. Spleen: Normal in size without focal abnormality. Adrenals/Urinary Tract: Adrenal glands are within normal limits. Kidneys demonstrate a normal enhancement pattern bilaterally. No renal calculi or obstructive changes are noted. The bladder is partially distended. Stomach/Bowel: No obstructive or inflammatory changes of the colon are noted. The appendix is within normal limits. Stomach is  decompressed. Known ulcer crater is not well appreciated on this exam. Known duodenal ulcer is not well appreciated either. Small bowel shows no obstructive changes. No definitive filling defect is identified. No areas of abnormal enhancement are seen. Vascular/Lymphatic: No significant vascular findings are present. No enlarged abdominal or pelvic lymph nodes. Reproductive: Prostate is unremarkable. Other: No abdominal wall hernia or abnormality. No abdominopelvic ascites.  Musculoskeletal: No acute or significant osseous findings. IMPRESSION: Acute abnormality is noted to correspond with the given clinical history. Electronically Signed   By: Alcide Clever M.D.   On: 05/11/2021 19:06    Microbiology: Results for orders placed or performed during the hospital encounter of 05/09/21  Resp Panel by RT-PCR (Flu A&B, Covid) Nasopharyngeal Swab     Status: None   Collection Time: 05/09/21 10:57 PM   Specimen: Nasopharyngeal Swab; Nasopharyngeal(NP) swabs in vial transport medium  Result Value Ref Range Status   SARS Coronavirus 2 by RT PCR NEGATIVE NEGATIVE Final    Comment: (NOTE) SARS-CoV-2 target nucleic acids are NOT DETECTED.  The SARS-CoV-2 RNA is generally detectable in upper respiratory specimens during the acute phase of infection. The lowest concentration of SARS-CoV-2 viral copies this assay can detect is 138 copies/mL. A negative result does not preclude SARS-Cov-2 infection and should not be used as the sole basis for treatment or other patient management decisions. A negative result may occur with  improper specimen collection/handling, submission of specimen other than nasopharyngeal swab, presence of viral mutation(s) within the areas targeted by this assay, and inadequate number of viral copies(<138 copies/mL). A negative result must be combined with clinical observations, patient history, and epidemiological information. The expected result is Negative.  Fact Sheet for Patients:  BloggerCourse.com  Fact Sheet for Healthcare Providers:  SeriousBroker.it  This test is no t yet approved or cleared by the [place] FDA and  has been authorized for detection and/or diagnosis of SARS-CoV-2 by FDA under an Emergency Use Authorization (EUA). This EUA will remain  in effect (meaning this test can be used) for the duration of the COVID-19 declaration under Section 564(b)(1) of the Act,  21 U.S.C.section 360bbb-3(b)(1), unless the authorization is terminated  or revoked sooner.       Influenza A by PCR NEGATIVE NEGATIVE Final   Influenza B by PCR NEGATIVE NEGATIVE Final    Comment: (NOTE) The Xpert Xpress SARS-CoV-2/FLU/RSV plus assay is intended as an aid in the diagnosis of influenza from Nasopharyngeal swab specimens and should not be used as a sole basis for treatment. Nasal washings and aspirates are unacceptable for Xpert Xpress SARS-CoV-2/FLU/RSV testing.  Fact Sheet for Patients: BloggerCourse.com  Fact Sheet for Healthcare Providers: SeriousBroker.it  This test is not yet approved or cleared by the [place] FDA and has been authorized for detection and/or diagnosis of SARS-CoV-2 by FDA under an Emergency Use Authorization (EUA). This EUA will remain in effect (meaning this test can be used) for the duration of the COVID-19 declaration under Section 564(b)(1) of the Act, 21 U.S.C. section 360bbb-3(b)(1), unless the authorization is terminated or revoked.  Performed at [facility], [address] [place], [place] [address]     Labs: CBC: Recent Labs  Lab 05/09/21 1523 05/09/21 1912 05/10/21 0507 05/10/21 1239 05/10/21 1932 05/11/21 0340 05/11/21 1241 05/12/21 0404  WBC 17.9* 17.1* 14.9*  --   --  12.2*  --  11.3*  HGB 6.3* 6.0* 5.9* 7.8* 7.5* 7.3* 7.8* 7.3*  HCT 20.9* 19.0* 18.3* 22.9* 22.6* 22.6*  24.8* 21.7*  MCV 89.7 91.3 91.0  --   --  91.5  --  90.4  PLT 315 290 237  --   --  241  --  260   Basic Metabolic Panel: Recent Labs  Lab 05/09/21 1523 05/09/21 1912  NA 139 132*  K 4.2 4.1  CL 104 101  CO2 29 25  GLUCOSE 142* 208*  BUN 15 18  CREATININE 0.84 0.90  CALCIUM 8.5* 8.0*   Liver Function Tests: Recent Labs  Lab 05/09/21 1523  AST 27  ALT 46*  ALKPHOS 38  BILITOT 0.2*  PROT 5.7*  ALBUMIN 3.0*   CBG: Recent Labs  Lab 05/11/21 0749 05/11/21 1953  05/11/21 2338 05/12/21 0355 05/12/21 0723  GLUCAP 101* 162* 112* 112* 114*    Discharge time spent: {LESS THAN/GREATER THAN:26388} 30 minutes.  Signed: Alba Cory, MD [facility] 05/12/2021

## 2021-05-12 NOTE — Discharge Instructions (Addendum)
No tomes  mas alcohol.  Tienes un medicamento nuevo Protonix para aydar a Scientist, research (medical) [place]. [place] tomarlo dos veces al dia for 4 semanas, despues toma una pastilla diaria.   No tomes Ibuprofen, diclonec, goudi powder.   Comienza a tomar ferrous sulfate (hierro) en 5 dias.

## 2021-05-12 NOTE — Discharge Summary (Addendum)
Physician Discharge Summary   Patient: Scott Hanna MRN: [id number] DOB: 1965-07-24  Admit date:     05/09/2021  Discharge date: 05/12/21  Discharge Physician: Alba Cory   PCP: Georgina Quint, MD   Recommendations at discharge:    Needs CBC to follow Hb level.  Needs to follow with GI in 12 weeks for repeat Endoscopy.  Follow up with GI , Biopsy results.   Discharge Diagnoses: Principal Problem:   Acute GI bleeding Active Problems:   Chronic obstructive pulmonary disease (HCC)   Dyslipidemia   Leukocytosis   Hyperglycemia   Alcohol use   Hyponatremia  Resolved Problems:   * No resolved hospital problems. [place] [facility] Course: 23 past medical history significant for COPD, rectal bleeding, thoracic aortic aneurysm, hyperlipidemia, anxiety who presents with generalized weakness, fatigue and shortness of breath.  He reports 2 weeks of melena.  He has been having on and off hematochezia.  He was found to have hemoglobin of 6.0.  FOBT positive.  GI consulted, underwent upper endoscopy showed; gastric and duodenal ulcer clean base. Underwent colonoscopy 2/22; three 6 to 8 mm polyps removes, diverticulosis, non bleeding internal hemorrhoids.   Assessment and Plan: * Acute GI bleeding- (present on admission) Symptomatic acute blood loss anemia. -Patient with a hemoglobin of 6.0, in the setting of melena and hematochezia.  -Received 3 units PRBC during this hospitalization. Hb stable 7.8--7.3 -Continue with Protonix  BID for 4 weeks them daily.  -GI consulted, underwent endoscopy showed gastric and duodenal ulcer, clean base. Biopsy pending.  Anemia panel normal.  Underwent colonoscopy: S/P three polypectomy, mild pan-colonic diverticulosis, non bleeding internal hemorrhoids.  CT abdomen pelvis negative.  He received IV iron, ferrous sulfate prescription was provided, he will start taking oral iron in 3-5 days,.to avoid confusion with melena.    Hyponatremia In the setting of hypovolemia and GI bleed.  Received IV fluids.   Alcohol use- (present on admission) - Continue with CIWA, thiamine and folic acid -No evidence of alcohol withdrawal currently. -Last alcoholic drink was Saturday.  No prior history of withdrawal. -He is determined to stop drinking alcohol.   Hyperglycemia- (present on admission) Random blood glucose in the 200s. 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  -Monitor CBG>   Leukocytosis- (present on admission) Likely reactive. COVID and influenza PCR negative. Chest  X ray: No radiographic evidence of acute cardiopulmonary process.  UA negative for infection. -procalcitoninless than 0.10.  Trending down.   Dyslipidemia- (present on admission) -Continue with  statin  Chronic obstructive pulmonary disease (HCC)- (present on admission) Stable, no evidence of acute exacerbation. -DuoNeb as needed    H pylori positive gastritis. GI should be getting in contact with patient.         Consultants: Dr Chales Abrahams, GI Procedures performed: Endoscopy/colonoscopy  Disposition: Home Diet recommendation:  Discharge Diet Orders (From admission, onward)     Start     Ordered   05/12/21 0000  Diet - low sodium heart healthy        05/12/21 1132           Cardiac diet  DISCHARGE MEDICATION: Allergies as of 05/12/2021   Not on File      Medication List     STOP taking these medications    Aspirin 81 81 MG EC tablet Generic drug: aspirin   buPROPion 150 MG 12 hr tablet Commonly known as: WELLBUTRIN SR   guaiFENesin 600 MG 12 hr tablet Commonly known  as: Mucinex   meclizine 25 MG tablet Commonly known as: ANTIVERT       TAKE these medications    acetaminophen 500 MG tablet Commonly known as: TYLENOL Take 1,000 mg by mouth every 6 (six) hours as needed for moderate pain or headache.   CertaVite/Antioxidants Tabs Tome 1 tableta por va oral diariamente. (Take 1 tablet by  mouth daily.) Start taking on: May 13, 2021   FeroSul 325 (65 FE) MG tablet Generic drug: ferrous sulfate Tome 1 tableta (325 mg en total) por va oral diariamente. (Take 1 tablet (325 mg total) by mouth daily.)   MELATONIN GUMMIES PO Take 20 mg by mouth at bedtime as needed (sleep).   pantoprazole 40 MG tablet Commonly known as: PROTONIX Take 1 tablet (40 mg total) by mouth 2 (two) times daily.   rosuvastatin 20 MG tablet Commonly known as: Crestor Take 1 tablet (20 mg total) by mouth daily.   traZODone 50 MG tablet Commonly known as: DESYREL Take 0.5-1 tablets (25-50 mg total) by mouth at bedtime as needed for sleep. What changed:  how much to take when to take this   vitamin B-12 250 MCG tablet Commonly known as: CYANOCOBALAMIN Tome 1 tableta (250 mcg en total) por va oral diariamente. (Take 1 tablet (250 mcg total) by mouth daily.) Start taking on: May 13, 2021        Follow-up Information     Sagardia, Eilleen Kempf, MD Follow up.   Specialty: Internal Medicine Why: necesitas, una cita con el medico en 3-4 dias para laboratoris, necesitas CBC para chequear hemoglobina. Contact information: [address] [place] [address] [phone]         Lynann Bologna, MD Follow up in 12 week(s).   Specialties: Gastroenterology, Internal Medicine Why: necesitas citas para endoscopia, para darle seguimient a las ulceras del stomago. Contact information: [address] [place] [address] [phone]                 Discharge Exam: [name] Weights   05/09/21 1900  Weight: 108.9 kg   General NAD Lung; CTA Abdomen; soft, nt  Condition at discharge: stable  The results of significant diagnostics from this hospitalization (including imaging, microbiology, ancillary and laboratory) are listed below for reference.   Imaging Studies: DG Chest 2 View  Result Date: 05/09/2021 CLINICAL DATA:  Lays for 10 days, history  of COPD EXAM: CHEST - 2 VIEW COMPARISON:  Chest radiograph 09/17/2019 FINDINGS: The cardiomediastinal silhouette is normal. There is no focal consolidation or pulmonary edema. There is no pleural effusion or pneumothorax. There is no acute osseous abnormality. IMPRESSION: No radiographic evidence of acute cardiopulmonary process. Electronically Signed   By: Lesia Hausen M.D.   On: 05/09/2021 15:16   CT ABDOMEN PELVIS W CONTRAST  Result Date: 05/11/2021 CLINICAL DATA:  GI hemorrhage with anemia, initial encounter EXAM: CT ABDOMEN AND PELVIS WITH CONTRAST TECHNIQUE: Multidetector CT imaging of the abdomen and pelvis was performed using the standard protocol following bolus administration of intravenous contrast. RADIATION DOSE REDUCTION: This exam was performed according to the departmental dose-optimization program which includes automated exposure control, adjustment of the mA and/or kV according to patient size and/or use of iterative reconstruction technique. CONTRAST:  52mL OMNIPAQUE IOHEXOL 350 MG/ML SOLN COMPARISON:  None. FINDINGS: Lower chest: No acute abnormality. Hepatobiliary: No focal liver abnormality is seen. No gallstones, gallbladder wall thickening, or biliary dilatation. Pancreas: Unremarkable. No pancreatic ductal dilatation or surrounding inflammatory changes. Spleen: Normal  in size without focal abnormality. Adrenals/Urinary Tract: Adrenal glands are within normal limits. Kidneys demonstrate a normal enhancement pattern bilaterally. No renal calculi or obstructive changes are noted. The bladder is partially distended. Stomach/Bowel: No obstructive or inflammatory changes of the colon are noted. The appendix is within normal limits. Stomach is decompressed. Known ulcer crater is not well appreciated on this exam. Known duodenal ulcer is not well appreciated either. Small bowel shows no obstructive changes. No definitive filling defect is identified. No areas of abnormal enhancement are seen.  Vascular/Lymphatic: No significant vascular findings are present. No enlarged abdominal or pelvic lymph nodes. Reproductive: Prostate is unremarkable. Other: No abdominal wall hernia or abnormality. No abdominopelvic ascites. Musculoskeletal: No acute or significant osseous findings. IMPRESSION: Acute abnormality is noted to correspond with the given clinical history. Electronically Signed   By: Alcide CleverMark  Lukens M.D.   On: 05/11/2021 19:06    Microbiology: Results for orders placed or performed during the hospital encounter of 05/09/21  Resp Panel by RT-PCR (Flu A&B, Covid) Nasopharyngeal Swab     Status: None   Collection Time: 05/09/21 10:57 PM   Specimen: Nasopharyngeal Swab; Nasopharyngeal(NP) swabs in vial transport medium  Result Value Ref Range Status   SARS Coronavirus 2 by RT PCR NEGATIVE NEGATIVE Final    Comment: (NOTE) SARS-CoV-2 target nucleic acids are NOT DETECTED.  The SARS-CoV-2 RNA is generally detectable in upper respiratory specimens during the acute phase of infection. The lowest concentration of SARS-CoV-2 viral copies this assay can detect is 138 copies/mL. A negative result does not preclude SARS-Cov-2 infection and should not be used as the sole basis for treatment or other patient management decisions. A negative result may occur with  improper specimen collection/handling, submission of specimen other than nasopharyngeal swab, presence of viral mutation(s) within the areas targeted by this assay, and inadequate number of viral copies(<138 copies/mL). A negative result must be combined with clinical observations, patient history, and epidemiological information. The expected result is Negative.  Fact Sheet for Patients:  BloggerCourse.comhttps://www.fda.gov/media/152166/download  Fact Sheet for Healthcare Providers:  SeriousBroker.ithttps://www.fda.gov/media/152162/download  This test is no t yet approved or cleared by the [place] FDA and  has been authorized for detection and/or diagnosis  of SARS-CoV-2 by FDA under an Emergency Use Authorization (EUA). This EUA will remain  in effect (meaning this test can be used) for the duration of the COVID-19 declaration under Section 564(b)(1) of the Act, 21 U.S.C.section 360bbb-3(b)(1), unless the authorization is terminated  or revoked sooner.       Influenza A by PCR NEGATIVE NEGATIVE Final   Influenza B by PCR NEGATIVE NEGATIVE Final    Comment: (NOTE) The Xpert Xpress SARS-CoV-2/FLU/RSV plus assay is intended as an aid in the diagnosis of influenza from Nasopharyngeal swab specimens and should not be used as a sole basis for treatment. Nasal washings and aspirates are unacceptable for Xpert Xpress SARS-CoV-2/FLU/RSV testing.  Fact Sheet for Patients: BloggerCourse.comhttps://www.fda.gov/media/152166/download  Fact Sheet for Healthcare Providers: SeriousBroker.ithttps://www.fda.gov/media/152162/download  This test is not yet approved or cleared by the [place] FDA and has been authorized for detection and/or diagnosis of SARS-CoV-2 by FDA under an Emergency Use Authorization (EUA). This EUA will remain in effect (meaning this test can be used) for the duration of the COVID-19 declaration under Section 564(b)(1) of the Act, 21 U.S.C. section 360bbb-3(b)(1), unless the authorization is terminated or revoked.  Performed at [facility], [address] [place], [place] [id number]     Labs: CBC: Recent Labs  Lab 05/09/21 1523 05/09/21 1912 05/10/21 0507 05/10/21 1239 05/10/21 1932 05/11/21 0340 05/11/21 1241 05/12/21 0404  WBC 17.9* 17.1* 14.9*  --   --  12.2*  --  11.3*  HGB 6.3* 6.0* 5.9* 7.8* 7.5* 7.3* 7.8* 7.3*  HCT 20.9* 19.0* 18.3* 22.9* 22.6* 22.6* 24.8* 21.7*  MCV 89.7 91.3 91.0  --   --  91.5  --  90.4  PLT 315 290 237  --   --  241  --  260   Basic Metabolic Panel: Recent Labs  Lab 05/09/21 1523 05/09/21 1912  NA 139 132*  K 4.2 4.1  CL 104 101  CO2 29 25  GLUCOSE 142* 208*  BUN 15 18  CREATININE 0.84 0.90   CALCIUM 8.5* 8.0*   Liver Function Tests: Recent Labs  Lab 05/09/21 1523  AST 27  ALT 46*  ALKPHOS 38  BILITOT 0.2*  PROT 5.7*  ALBUMIN 3.0*   CBG: Recent Labs  Lab 05/11/21 1953 05/11/21 2338 05/12/21 0355 05/12/21 0723 05/12/21 1142  GLUCAP 162* 112* 112* 114* 150*    Discharge time spent: greater than 30 minutes.  Signed: Alba Cory, MD [facility] 05/12/2021

## 2021-05-12 NOTE — Progress Notes (Signed)
CSW printed ETOH use resources in Spanish for patient with community resources for cessation. RN wheeling patient downstairs to his ride.   Joaquin Courts, MSW, [facility]

## 2021-05-13 LAB — SURGICAL PATHOLOGY

## 2021-05-16 ENCOUNTER — Encounter: Payer: Self-pay | Admitting: Gastroenterology

## 2021-05-18 ENCOUNTER — Other Ambulatory Visit: Payer: Self-pay

## 2021-05-18 ENCOUNTER — Encounter: Payer: Self-pay | Admitting: Emergency Medicine

## 2021-05-18 ENCOUNTER — Ambulatory Visit: Payer: BC Managed Care – PPO | Admitting: Emergency Medicine

## 2021-05-18 VITALS — BP 146/70 | HR 84 | Ht 72.0 in | Wt 245.0 lb

## 2021-05-18 DIAGNOSIS — Z09 Encounter for follow-up examination after completed treatment for conditions other than malignant neoplasm: Secondary | ICD-10-CM | POA: Diagnosis not present

## 2021-05-18 DIAGNOSIS — K279 Peptic ulcer, site unspecified, unspecified as acute or chronic, without hemorrhage or perforation: Secondary | ICD-10-CM | POA: Diagnosis not present

## 2021-05-18 DIAGNOSIS — K5730 Diverticulosis of large intestine without perforation or abscess without bleeding: Secondary | ICD-10-CM

## 2021-05-18 DIAGNOSIS — K922 Gastrointestinal hemorrhage, unspecified: Secondary | ICD-10-CM | POA: Diagnosis not present

## 2021-05-18 LAB — CBC WITH DIFFERENTIAL/PLATELET
Basophils Absolute: 0.1 10*3/uL (ref 0.0–0.1)
Basophils Relative: 0.6 % (ref 0.0–3.0)
Eosinophils Absolute: 0.1 10*3/uL (ref 0.0–0.7)
Eosinophils Relative: 1.6 % (ref 0.0–5.0)
HCT: 28.4 % — ABNORMAL LOW (ref 39.0–52.0)
Hemoglobin: 9.4 g/dL — ABNORMAL LOW (ref 13.0–17.0)
Lymphocytes Relative: 18.8 % (ref 12.0–46.0)
Lymphs Abs: 1.7 10*3/uL (ref 0.7–4.0)
MCHC: 33 g/dL (ref 30.0–36.0)
MCV: 87.1 fl (ref 78.0–100.0)
Monocytes Absolute: 0.8 10*3/uL (ref 0.1–1.0)
Monocytes Relative: 9.3 % (ref 3.0–12.0)
Neutro Abs: 6.3 10*3/uL (ref 1.4–7.7)
Neutrophils Relative %: 69.7 % (ref 43.0–77.0)
Platelets: 393 10*3/uL (ref 150.0–400.0)
RBC: 3.26 Mil/uL — ABNORMAL LOW (ref 4.22–5.81)
RDW: 15.6 % — ABNORMAL HIGH (ref 11.5–15.5)
WBC: 9.1 10*3/uL (ref 4.0–10.5)

## 2021-05-18 LAB — COMPREHENSIVE METABOLIC PANEL
ALT: 29 U/L (ref 0–53)
AST: 19 U/L (ref 0–37)
Albumin: 4.1 g/dL (ref 3.5–5.2)
Alkaline Phosphatase: 50 U/L (ref 39–117)
BUN: 10 mg/dL (ref 6–23)
CO2: 30 mEq/L (ref 19–32)
Calcium: 9.3 mg/dL (ref 8.4–10.5)
Chloride: 105 mEq/L (ref 96–112)
Creatinine, Ser: 0.9 mg/dL (ref 0.40–1.50)
GFR: 94.97 mL/min (ref >60.00)
Glucose, Bld: 101 mg/dL — ABNORMAL HIGH (ref 70–99)
Potassium: 4.4 mEq/L (ref 3.5–5.1)
Sodium: 141 mEq/L (ref 135–145)
Total Bilirubin: 0.4 mg/dL (ref 0.2–1.2)
Total Protein: 6.8 g/dL (ref 6.0–8.3)

## 2021-05-18 NOTE — Progress Notes (Signed)
Scott Hanna 56 y.o.   Chief Complaint  Patient presents with   Hospitalization Follow-up    Possible GI bleed, anemia    HISTORY OF PRESENT ILLNESS: This is a 56 y.o. male here for hospital discharge follow-up.  He was admitted on 05/09/2021 with lower GI bleed and orthostatic hypotension with severe anemia.  Discharged on 05/12/2021 after getting blood transfusions, IV iron therapy, GI evaluation with upper and lower endoscopies.  Feeling much better.  Asymptomatic today. No complaints or any other medical concerns. Hospital discharge summary as follows: Physician Discharge Summary    Patient: Scott Hanna MRN: [id number] DOB: 01/16/1966  Admit date:     05/09/2021  Discharge date: 05/12/21  Discharge Physician: Alba Cory    PCP: Georgina Quint, MD    Recommendations at discharge:     Needs CBC to follow Hb level.  Needs to follow with GI in 12 weeks for repeat Endoscopy.  Follow up with GI , Biopsy results.    Discharge Diagnoses: Principal Problem:   Acute GI bleeding Active Problems:   Chronic obstructive pulmonary disease (HCC)   Dyslipidemia   Leukocytosis   Hyperglycemia   Alcohol use   Hyponatremia  Hospital Course: 55 past medical history significant for COPD, rectal bleeding, thoracic aortic aneurysm, hyperlipidemia, anxiety who presents with generalized weakness, fatigue and shortness of breath.  He reports 2 weeks of melena.  He has been having on and off hematochezia.  He was found to have hemoglobin of 6.0.  FOBT positive.  GI consulted, underwent upper endoscopy showed; gastric and duodenal ulcer clean base. Underwent colonoscopy 2/22; three 6 to 8 mm polyps removes, diverticulosis, non bleeding internal hemorrhoids.    Assessment and Plan: * Acute GI bleeding- (present on admission) Symptomatic acute blood loss anemia. -Patient with a hemoglobin of 6.0, in the setting of melena and hematochezia.  -Received 3 units PRBC during  this hospitalization. Hb stable 7.8--7.3 -Continue with Protonix  BID for 4 weeks them daily.  -GI consulted, underwent endoscopy showed gastric and duodenal ulcer, clean base. Biopsy pending.  Anemia panel normal.  Underwent colonoscopy: S/P three polypectomy, mild pan-colonic diverticulosis, non bleeding internal hemorrhoids.  CT abdomen pelvis negative.  He received IV iron, ferrous sulfate prescription was provided, he will start taking oral iron in 3-5 days,.to avoid confusion with melena.    Hyponatremia In the setting of hypovolemia and GI bleed.  Received IV fluids.    Alcohol use- (present on admission) - Continue with CIWA, thiamine and folic acid -No evidence of alcohol withdrawal currently. -Last alcoholic drink was Saturday.  No prior history of withdrawal. -He is determined to stop drinking alcohol.    Hyperglycemia- (present on admission) Random blood glucose in the 200s. 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  -Monitor CBG>    Leukocytosis- (present on admission) Likely reactive. COVID and influenza PCR negative. Chest  X ray: No radiographic evidence of acute cardiopulmonary process.  UA negative for infection. -procalcitoninless than 0.10.  Trending down.    Dyslipidemia- (present on admission) -Continue with  statin   Chronic obstructive pulmonary disease (HCC)- (present on admission) Stable, no evidence of acute exacerbation. -DuoNeb as needed       H pylori positive gastritis. GI should be getting in contact with patient.     HPI   Prior to Admission medications   Medication Sig Start Date End Date Taking? Authorizing Provider  acetaminophen (TYLENOL) 500 MG tablet Take 1,000 mg by mouth  every 6 (six) hours as needed for moderate pain or headache.   Yes [provider]  ferrous sulfate 325 (65 FE) MG tablet Take 1 tablet (325 mg total) by mouth daily. 05/12/21 05/12/22 Yes Regalado, Belkys A, MD  MELATONIN GUMMIES PO Take 20 mg by  mouth at bedtime as needed (sleep).   Yes [provider]  Multiple Vitamins-Minerals (CERTAVITE/ANTIOXIDANTS) TABS Take 1 tablet by mouth daily. 05/13/21  Yes Regalado, Belkys A, MD  pantoprazole (PROTONIX) 40 MG tablet Take 1 tablet (40 mg total) by mouth 2 (two) times daily. 05/12/21  Yes Regalado, Belkys A, MD  rosuvastatin (CRESTOR) 20 MG tablet Take 1 tablet (20 mg total) by mouth daily. 08/19/20  Yes Shamar Kracke, Eilleen KempfMiguel Jose, MD  traZODone (DESYREL) 50 MG tablet Take 0.5-1 tablets (25-50 mg total) by mouth at bedtime as needed for sleep. Patient taking differently: Take 50-75 mg by mouth at bedtime. 01/10/21  Yes Reyli Schroth, Eilleen KempfMiguel Jose, MD  vitamin B-12 (CYANOCOBALAMIN) 250 MCG tablet Take 1 tablet (250 mcg total) by mouth daily. 05/13/21  Yes Alba Coryegalado, Belkys A, MD    Not on File  Patient Active Problem List   Diagnosis Date Noted   Leukocytosis 05/10/2021   Hyperglycemia 05/10/2021   Alcohol use 05/10/2021   Hyponatremia 05/10/2021   Acute GI bleeding 05/09/2021   Insomnia 01/10/2021   Anxiety 01/10/2021   Rectal bleeding 01/10/2021   Dyslipidemia 08/19/2020   Post-void dribbling 08/19/2020   Thoracic aortic aneurysm without rupture 06/17/2020   Chronic obstructive pulmonary disease (HCC) 02/05/2020    Past Medical History:  Diagnosis Date   COPD (chronic obstructive pulmonary disease) (HCC)    GI bleed    Thoracic aortic aneurysm without rupture     Past Surgical History:  Procedure Laterality Date   BIOPSY  05/10/2021   Procedure: BIOPSY;  Surgeon: Lynann BolognaGupta, Rajesh, MD;  [facility];  Service: Gastroenterology;;   COLONOSCOPY WITH PROPOFOL N/A 05/11/2021   Procedure: COLONOSCOPY WITH PROPOFOL;  Surgeon: Lynann BolognaGupta, Rajesh, MD;  [facility];  Service: Gastroenterology;  Laterality: N/A;   ESOPHAGOGASTRODUODENOSCOPY (EGD) WITH PROPOFOL N/A 05/10/2021   Procedure: ESOPHAGOGASTRODUODENOSCOPY (EGD) WITH PROPOFOL;  Surgeon: Lynann BolognaGupta, Rajesh, MD;  [facility];  Service: Gastroenterology;  Laterality: N/A;   POLYPECTOMY  05/11/2021   Procedure: POLYPECTOMY;  Surgeon: Lynann BolognaGupta, Rajesh, MD;  [facility];  Service: Gastroenterology;;    Social History   Socioeconomic History   Marital status: Married    Spouse name: Not on file   Number of children: Not on file   Years of education: Not on file   Highest education level: Not on file  Occupational History   Not on file  Tobacco Use   Smoking status: Former    Packs/day: 1.00    Types: Cigarettes   Smokeless tobacco: Never   Tobacco comments:    6 months ago stopped smoking.    Substance and Sexual Activity   Alcohol use: Not Currently   Drug use: Never   Sexual activity: Yes  Other Topics Concern   Not on file  Social History Narrative   Not on file   Social Determinants of Health   Financial Resource Strain: Not on file  Food Insecurity: Not on file  Transportation Needs: Not on file  Physical Activity: Not on file  Stress: Not on file  Social Connections: Not on file  Intimate Partner Violence: Not on file    No family history on file.   Review of Systems  Constitutional: Negative.  Negative for chills and fever.  HENT: Negative.  Negative for congestion, nosebleeds and sore throat.   Respiratory: Negative.  Negative for cough and shortness of breath.   Cardiovascular: Negative.  Negative for chest pain and palpitations.  Gastrointestinal: Negative.  Negative for abdominal pain, blood in stool, diarrhea, heartburn, melena, nausea and vomiting.  Genitourinary: Negative.  Negative for dysuria and hematuria.  Skin: Negative.  Negative for rash.  Neurological: Negative.  Negative for dizziness and headaches.  All other systems reviewed and are negative.  Today's Vitals   05/18/21 1001  BP: (!) 146/70  Pulse: 84  SpO2: 94%  Weight: 245 lb (111.1 kg)  Height: 6' (1.829 m)   Body mass index is 33.23 kg/m.  Physical Exam Vitals reviewed.   Constitutional:      Appearance: Normal appearance.  HENT:     Head: Normocephalic.     Mouth/Throat:     Mouth: Mucous membranes are moist.     Pharynx: Oropharynx is clear.  Eyes:     Extraocular Movements: Extraocular movements intact.     Pupils: Pupils are equal, round, and reactive to light.  Cardiovascular:     Rate and Rhythm: Normal rate and regular rhythm.     Pulses: Normal pulses.     Heart sounds: Normal heart sounds.  Pulmonary:     Effort: Pulmonary effort is normal.     Breath sounds: Normal breath sounds.  Abdominal:     General: There is no distension.     Palpations: Abdomen is soft.     Tenderness: There is no abdominal tenderness.  Musculoskeletal:        General: Normal range of motion.     Cervical back: Neck supple. No tenderness.  Lymphadenopathy:     Cervical: No cervical adenopathy.  Skin:    General: Skin is warm and dry.     Capillary Refill: Capillary refill takes less than 2 seconds.  Neurological:     General: No focal deficit present.     Mental Status: He is alert and oriented to person, place, and time.  Psychiatric:        Mood and Affect: Mood normal.        Behavior: Behavior normal.     ASSESSMENT & PLAN: A total of 47 minutes was spent with the patient and counseling/coordination of care regarding preparing for this visit, review of recent hospital admission medical records, review of discharge summary, review of most recent blood work results, review of most recent upper and lower endoscopies reports, review of all medications, education on nutrition, need to stop drinking, prognosis, need for GI follow-up, documentation and need for follow-up in 4 weeks.  Problem List Items Addressed This Visit       Digestive   Lower GI bleed    Stable vital signs.  Benign abdominal examination. No symptoms of GI bleeding. Much improved. CBC done today.      Relevant Orders   CBC with Differential/Platelet   Comprehensive metabolic  panel   Ambulatory referral to Gastroenterology   Peptic ulcer disease   Relevant Orders   Ambulatory referral to Gastroenterology     Other   Pancolonic diverticulosis    No complications at present time.  Stable.      Relevant Orders   Ambulatory referral to Gastroenterology   Other Visit Diagnoses     Hospital discharge follow-up    -  Primary      Patient Instructions  Diverticulosis  Diverticulosis La diverticulosis es una enfermedad que se manifiesta cuando se forman bolsillos pequeos (divertculos) en las paredes del intestino grueso (colon). El colon es donde se absorbe el agua y se forman la materia fecal (heces). Los bolsillos se forman cuando la capa interna del colon ejerce presin sobre los puntos dbiles de las capas externas. Puede tener unos pocos bolsillos o muchos. Las bolsas generalmente no causan problemas, a menos que se inflamen o infecten. Cuando esto ocurre, la afeccin se denomina diverticulitis. Cules son las causas? Se desconoce la causa de esta afeccin. Qu incrementa el riesgo? Los siguientes factores pueden hacer que sea ms propenso a Aeronautical engineerdesarrollar esta afeccin: Ser mayor de 60 aos de edad. El riesgo de desarrollar esta enfermedad aumenta con la edad. La diverticulosis es poco frecuente en las personas menores de 30 aos. A los 80 aos, muchas personas sufren esta enfermedad. Comer una dieta con bajo contenido de [place]fibra. Tener estreimiento frecuente. Tener sobrepeso. No hacer suficiente ejercicio fsico. Fumar. Tomar analgsicos de [address] lib[address], como aspirina e ibuprofeno. Tener antecedentes familiares de diverticulosis. Cules son los signos o sntomas? En la Franklin Resourcesmayora de las personas, esta afeccin no presenta sntomas. Si tiene sntomas, pueden incluir los siguientes: Meteorismo. Clicos abdominales. Estreimiento o diarrea. Dolor en la parte inferior izquierda del abdomen. Cmo se diagnostica? Debido a que la diverticulosis  generalmente no presenta sntomas, la mayora de las veces se diagnostica durante un examen para Engineer, manufacturingdetectar otros problemas del colon. Este trastorno puede diagnosticarse por los siguientes mtodos: Usar un colonoscopio flexible para Psychologist, prison and probation servicesexaminar el colon (colonoscopia). Hacer una radiografa del colon despus de aplicar un tinte (enema de bario). Realizarse una exploracin por tomografa computarizada (TC). Cmo se trata? Es posible que no necesite tratamiento para Copyesta afeccin. El mdico puede recomendarle tratamiento para prevenir problemas. Es posible que necesite tratamiento si tiene sntomas o si tuvo diverticulitis anteriormente. El tratamiento puede incluir: Comer alimentos con alto contenido de Surprisefibra. Tomar un suplemento de [place]fibra. Tomar un suplemento de bacterias vivas (probitico). Tomar medicamentos para relajar el colon. Siga estas instrucciones en su casa: Medicamentos Use los medicamentos de venta libre y los recetados solamente como se lo haya indicado el mdico. Si se lo indica el mdico, tome un suplemento con fibras o probitico. Prevencin del estreimiento Su afeccin puede causar estreimiento. Para prevenir o tratar el estreimiento, es posible que deba hacer lo siguiente: Beba suficiente lquido como para Pharmacologistmantener la orina de color amarillo plido. Use medicamentos recetados o de H. J. Heinzventa libre. Consuma alimentos ricos en fibra, como frijoles, cereales integrales, y frutas y verduras frescas. Limite su consumo de alimentos ricos en grasa y azcares procesados, como los alimentos fritos o dulces.  Instrucciones generales Trate de no hacer fuerza al defecar. Concurra a todas las visitas de [address] como se lo haya indicado el mdico. Esto es importante. Comunquese con un mdico si: Tiene dolor en el abdomen. Tiene meteorismo. Tiene clicos. No defeca durante 3 das. Solicite ayuda de inmediato si: El Product/process development scientistdolor empeora. El meteorismo The Timken Companyempeora mucho. Tiene fiebre o  escalofros, y los sntomas empeoran repentinamente. Vomita. Su materia fecal es sanguinolenta o negra. Presenta un sangrado que proviene del recto. Resumen La diverticulosis es una enfermedad que se manifiesta cuando se forman bolsillos pequeos (divertculos) en las paredes del intestino grueso (colon). Puede tener unos pocos bolsillos o muchos. Esta afeccin suele diagnosticarse durante un examen para detectar otros problemas del colon. El tratamiento puede incluir aumentar la cantidad de fibra en la dieta, tomar suplementos o tomar medicamentos.  Esta informacin no tiene Theme park manager el consejo del mdico. Asegrese de hacerle al mdico cualquier pregunta que tenga. Document Revised: 11/20/2018 Document Reviewed: 11/20/2018 Elsevier Patient Education  2022 Elsevier Inc.    Edwina Barth, MD [facility]

## 2021-05-18 NOTE — Assessment & Plan Note (Signed)
No complications at present time.  Stable. ?

## 2021-05-18 NOTE — Assessment & Plan Note (Signed)
Stable vital signs.  Benign abdominal examination. ?No symptoms of GI bleeding. ?Much improved. ?CBC done today. ?

## 2021-05-18 NOTE — Patient Instructions (Signed)
Diverticulosis °Diverticulosis °La diverticulosis es una enfermedad que se manifiesta cuando se forman bolsillos pequeños (divertículos) en las paredes del intestino grueso (colon). El colon es donde se absorbe el agua y se forman la materia fecal (heces). Los bolsillos se forman cuando la capa interna del colon ejerce presión sobre los puntos débiles de las capas externas. Puede tener unos pocos bolsillos o muchos. °Las bolsas generalmente no causan problemas, a menos que se inflamen o infecten. Cuando esto ocurre, la afección se denomina diverticulitis. °¿Cuáles son las causas? °Se desconoce la causa de esta afección. °¿Qué incrementa el riesgo? °Los siguientes factores pueden hacer que sea más propenso a desarrollar esta afección: °Ser mayor de 60 años de edad. El riesgo de desarrollar esta enfermedad aumenta con la edad. La diverticulosis es poco frecuente en las personas menores de 30 años. A los 80 años, muchas personas sufren esta enfermedad. °Comer una dieta con bajo contenido de fibra. °Tener estreñimiento frecuente. °Tener sobrepeso. °No hacer suficiente ejercicio físico. °Fumar. °Tomar analgésicos de venta libre, como aspirina e ibuprofeno. °Tener antecedentes familiares de diverticulosis. °¿Cuáles son los signos o síntomas? °En la mayoría de las personas, esta afección no presenta síntomas. Si tiene síntomas, pueden incluir los siguientes: °Meteorismo. °Cólicos abdominales. °Estreñimiento o diarrea. °Dolor en la parte inferior izquierda del abdomen. °¿Cómo se diagnostica? °Debido a que la diverticulosis generalmente no presenta síntomas, la mayoría de las veces se diagnostica durante un examen para detectar otros problemas del colon. Este trastorno puede diagnosticarse por los siguientes métodos: °Usar un colonoscopio flexible para examinar el colon (colonoscopia). °Hacer una radiografía del colon después de aplicar un tinte (enema de bario). °Realizarse una exploración por tomografía computarizada  (TC). °¿Cómo se trata? °Es posible que no necesite tratamiento para esta afección. El médico puede recomendarle tratamiento para prevenir problemas. Es posible que necesite tratamiento si tiene síntomas o si tuvo diverticulitis anteriormente. El tratamiento puede incluir: °Comer alimentos con alto contenido de fibra. °Tomar un suplemento de fibra. °Tomar un suplemento de bacterias vivas (probiótico). °Tomar medicamentos para relajar el colon. °Siga estas instrucciones en su casa: °Medicamentos °Use los medicamentos de venta libre y los recetados solamente como se lo haya indicado el médico. °Si se lo indica el médico, tome un suplemento con fibras o probiótico. °Prevención del estreñimiento °Su afección puede causar estreñimiento. Para prevenir o tratar el estreñimiento, es posible que deba hacer lo siguiente: °Beba suficiente líquido como para mantener la orina de color amarillo pálido. °Use medicamentos recetados o de venta libre. °Consuma alimentos ricos en fibra, como frijoles, cereales integrales, y frutas y verduras frescas. °Limite su consumo de alimentos ricos en grasa y azúcares procesados, como los alimentos fritos o dulces. ° °Instrucciones generales °Trate de no hacer fuerza al defecar. °Concurra a todas las visitas de seguimiento como se lo haya indicado el médico. Esto es importante. °Comuníquese con un médico si: °Tiene dolor en el abdomen. °Tiene meteorismo. °Tiene cólicos. °No defeca durante 3 días. °Solicite ayuda de inmediato si: °El dolor empeora. °El meteorismo empeora mucho. °Tiene fiebre o escalofríos, y los síntomas empeoran repentinamente. °Vomita. °Su materia fecal es sanguinolenta o negra. °Presenta un sangrado que proviene del recto. °Resumen °La diverticulosis es una enfermedad que se manifiesta cuando se forman bolsillos pequeños (divertículos) en las paredes del intestino grueso (colon). °Puede tener unos pocos bolsillos o muchos. °Esta afección suele diagnosticarse durante un examen  para detectar otros problemas del colon. °El tratamiento puede incluir aumentar la cantidad de fibra en la dieta, tomar suplementos o tomar   medicamentos. °Esta información no tiene como fin reemplazar el consejo del médico. Asegúrese de hacerle al médico cualquier pregunta que tenga. °Document Revised: 11/20/2018 Document Reviewed: 11/20/2018 °Elsevier Patient Education © 2022 Elsevier Inc. ° °

## 2021-06-16 ENCOUNTER — Encounter: Payer: Self-pay | Admitting: Emergency Medicine

## 2021-06-16 ENCOUNTER — Ambulatory Visit: Payer: BC Managed Care – PPO | Admitting: Emergency Medicine

## 2021-06-16 VITALS — BP 124/84 | HR 87 | Temp 98.2°F | Ht 72.0 in | Wt 242.4 lb

## 2021-06-16 DIAGNOSIS — K922 Gastrointestinal hemorrhage, unspecified: Secondary | ICD-10-CM

## 2021-06-16 DIAGNOSIS — K279 Peptic ulcer, site unspecified, unspecified as acute or chronic, without hemorrhage or perforation: Secondary | ICD-10-CM | POA: Diagnosis not present

## 2021-06-16 DIAGNOSIS — K5730 Diverticulosis of large intestine without perforation or abscess without bleeding: Secondary | ICD-10-CM

## 2021-06-16 NOTE — Patient Instructions (Signed)
Mantenimiento de la salud en los hombres °Health Maintenance, Male °Adoptar un estilo de vida saludable y recibir atención preventiva son importantes para promover la salud y el bienestar. Consulte al médico sobre: °El esquema adecuado para hacerse pruebas y exámenes periódicos. °Cosas que puede hacer por su cuenta para prevenir enfermedades y mantenerse sano. °¿Qué debo saber sobre la dieta, el peso y el ejercicio? °Consuma una dieta saludable ° °Consuma una dieta que incluya muchas verduras, frutas, productos lácteos con bajo contenido de grasa y proteínas magras. °No consuma muchos alimentos ricos en grasas sólidas, azúcares agregados o sodio. °Mantenga un peso saludable °El índice de masa muscular (IMC) es una medida que puede utilizarse para identificar posibles problemas de peso. Proporciona una estimación de la grasa corporal basándose en el peso y la altura. Su médico puede ayudarle a determinar su IMC y a lograr o mantener un peso saludable. °Haga ejercicio con regularidad °Haga ejercicio con regularidad. Esta es una de las prácticas más importantes que puede hacer por su salud. La mayoría de los adultos deben seguir estas pautas: °Realizar, al menos, 150 minutos de actividad física por semana. El ejercicio debe aumentar la frecuencia cardíaca y hacerlo transpirar (ejercicio de intensidad moderada). °Hacer ejercicios de fortalecimiento por lo menos dos veces por semana. Agregue esto a su plan de ejercicio de intensidad moderada. °Pase menos tiempo sentado. Incluso la actividad física ligera puede ser beneficiosa. °Controle sus niveles de colesterol y lípidos en la sangre °Comience a realizarse análisis de lípidos y colesterol en la sangre a los 20 años y luego repítalos cada 5 años. °Es posible que necesite controlar los niveles de colesterol con mayor frecuencia si: °Sus niveles de lípidos y colesterol son altos. °Es mayor de 40 años. °Presenta un alto riesgo de padecer enfermedades cardíacas. °¿Qué debo  saber sobre las pruebas de detección del cáncer? °Muchos tipos de cáncer pueden detectarse de manera temprana y, a menudo, pueden prevenirse. Según su historia clínica y sus antecedentes familiares, es posible que deba realizarse pruebas de detección del cáncer en diferentes edades. Esto puede incluir pruebas de detección de lo siguiente: °Cáncer colorrectal. °Cáncer de próstata. °Cáncer de piel. °Cáncer de pulmón. °¿Qué debo saber sobre la enfermedad cardíaca, la diabetes y la hipertensión arterial? °Presión arterial y enfermedad cardíaca °La hipertensión arterial causa enfermedades cardíacas y aumenta el riesgo de accidente cerebrovascular. Es más probable que esto se manifieste en las personas que tienen lecturas de presión arterial alta o tienen sobrepeso. °Hable con el médico sobre sus valores de presión arterial deseados. °Hágase controlar la presión arterial: °Cada 3 a 5 años si tiene entre 18 y 39 años. °Todos los años si es mayor de 40 años. °Si tiene entre 65 y 75 años y es fumador o solía fumar, pregúntele al médico si debe realizarse una prueba de detección de aneurisma aórtico abdominal (AAA) por única vez. °Diabetes °Realícese exámenes de detección de la diabetes con regularidad. Este análisis revisa el nivel de azúcar en la sangre en ayunas. Hágase las pruebas de detección: °Cada tres años después de los 45 años de edad si tiene un peso normal y un bajo riesgo de padecer diabetes. °Con más frecuencia y a partir de una edad inferior si tiene sobrepeso o un alto riesgo de padecer diabetes. °¿Qué debo saber sobre la prevención de infecciones? °Hepatitis B °Si tiene un riesgo más alto de contraer hepatitis B, debe someterse a un examen de detección de este virus. Hable con el médico para averiguar si tiene riesgo de   contraer la infección por hepatitis B. °Hepatitis C °Se recomienda un análisis de sangre para: °Todos los que nacieron entre 1945 y 1965. °Todas las personas que tengan un riesgo de haber  contraído hepatitis C. °Enfermedades de transmisión sexual (ETS) °Debe realizarse pruebas de detección de ITS todos los años, incluidas la gonorrea y la clamidia, si: °Es sexualmente activo y es menor de 24 años. °Es mayor de 24 años, y el médico le informa que corre riesgo de tener este tipo de infecciones. °La actividad sexual ha cambiado desde que le hicieron la última prueba de detección y tiene un riesgo mayor de tener clamidia o gonorrea. Pregúntele al médico si usted tiene riesgo. °Pregúntele al médico si usted tiene un alto riesgo de contraer VIH. El médico también puede recomendarle un medicamento recetado para ayudar a evitar la infección por el VIH. Si elige tomar medicamentos para prevenir el VIH, primero debe hacerse los análisis de detección del VIH. Luego debe hacerse análisis cada 3 meses mientras esté tomando los medicamentos. °Siga estas indicaciones en su casa: °Consumo de alcohol °No beba alcohol si el médico se lo prohíbe. °Si bebe alcohol: °Limite la cantidad que consume de 0 a 2 bebidas por día. °Sepa cuánta cantidad de alcohol hay en las bebidas que toma. En [place], una medida equivale a una botella de cerveza de 12 oz (355 ml), un vaso de vino de 5 oz (148 ml) o un vaso de una bebida alcohólica de alta graduación de 1½ oz (44 ml). °Estilo de vida °No consuma ningún producto que contenga nicotina o tabaco. Estos productos incluyen cigarrillos, tabaco para mascar y aparatos de vapeo, como los cigarrillos electrónicos. Si necesita ayuda para dejar de consumir estos productos, consulte al médico. °No consuma drogas. °No comparta agujas. °Solicite ayuda a su médico si necesita apoyo o información para abandonar las drogas. °Indicaciones generales °Realícese los estudios de rutina de la salud, dentales y de la vista. °Manténgase al día con las vacunas. °Infórmele a su médico si: °Se siente deprimido con frecuencia. °Alguna vez ha sido víctima de maltrato o no se siente seguro en su  casa. °Resumen °Adoptar un estilo de vida saludable y recibir atención preventiva son importantes para promover la salud y el bienestar. °Siga las instrucciones del médico acerca de una dieta saludable, el ejercicio y la realización de pruebas o exámenes para detectar enfermedades. °Siga las instrucciones del médico con respecto al control del colesterol y la presión arterial. °Esta información no tiene como fin reemplazar el consejo del médico. Asegúrese de hacerle al médico cualquier pregunta que tenga. °Document Revised: 08/11/2020 Document Reviewed: 08/11/2020 °Elsevier Patient Education © 2022 [employer] Inc. ° °

## 2021-06-16 NOTE — Progress Notes (Signed)
Scott Hanna ?56 y.o. ? ? ?Chief Complaint  ?Patient presents with  ? Follow-up  ? ? ?HISTORY OF PRESENT ILLNESS: ?This is a 56 y.o. male here for follow-up of GI bleed. ?Doing well.  Asymptomatic. ?No complaints or medical concerns today. ?Lab Results  ?Component Value Date  ? WBC 9.1 05/18/2021  ? HGB 9.4 (L) 05/18/2021  ? HCT 28.4 (L) 05/18/2021  ? MCV 87.1 05/18/2021  ? PLT 393.0 05/18/2021  ? ? ? ?HPI ? ? ?Prior to Admission medications   ?Medication Sig Start Date End Date Taking? Authorizing Provider  ?acetaminophen (TYLENOL) 500 MG tablet Take 1,000 mg by mouth every 6 (six) hours as needed for moderate pain or headache.   Yes [provider]  ?ferrous sulfate 325 (65 FE) MG tablet Take 1 tablet (325 mg total) by mouth daily. 05/12/21 05/12/22 Yes Regalado, Belkys A, MD  ?MELATONIN GUMMIES PO Take 20 mg by mouth at bedtime as needed (sleep).   Yes [provider]  ?Multiple Vitamins-Minerals (CERTAVITE/ANTIOXIDANTS) TABS Take 1 tablet by mouth daily. 05/13/21  Yes Regalado, Belkys A, MD  ?pantoprazole (PROTONIX) 40 MG tablet Take 1 tablet (40 mg total) by mouth 2 (two) times daily. 05/12/21  Yes Regalado, Belkys A, MD  ?rosuvastatin (CRESTOR) 20 MG tablet Take 1 tablet (20 mg total) by mouth daily. 08/19/20  Yes Lyonel Morejon, Eilleen Kempf, MD  ?traZODone (DESYREL) 50 MG tablet Take 0.5-1 tablets (25-50 mg total) by mouth at bedtime as needed for sleep. ?Patient taking differently: Take 50-75 mg by mouth at bedtime. 01/10/21  Yes Alinna Siple, Eilleen Kempf, MD  ?vitamin B-12 (CYANOCOBALAMIN) 250 MCG tablet Take 1 tablet (250 mcg total) by mouth daily. 05/13/21  Yes Regalado, Prentiss Bells, MD  ? ? ?Not on File ? ?Patient Active Problem List  ? Diagnosis Date Noted  ? Pancolonic diverticulosis 05/18/2021  ? Peptic ulcer disease 05/18/2021  ? Alcohol use 05/10/2021  ? Lower GI bleed 05/09/2021  ? Anxiety 01/10/2021  ? Rectal bleeding 01/10/2021  ? Dyslipidemia 08/19/2020  ? Post-void dribbling 08/19/2020   ? Thoracic aortic aneurysm without rupture 06/17/2020  ? Chronic obstructive pulmonary disease (HCC) 02/05/2020  ? ? ?Past Medical History:  ?Diagnosis Date  ? COPD (chronic obstructive pulmonary disease) (HCC)   ? GI bleed   ? Thoracic aortic aneurysm without rupture   ? ? ?Past Surgical History:  ?Procedure Laterality Date  ? BIOPSY  05/10/2021  ? Procedure: BIOPSY;  Surgeon: Lynann Bologna, MD;  Location: [facility] ENDOSCOPY;  Service: Gastroenterology;;  ? COLONOSCOPY WITH PROPOFOL N/A 05/11/2021  ? Procedure: COLONOSCOPY WITH PROPOFOL;  Surgeon: Lynann Bologna, MD;  Location: [facility] ENDOSCOPY;  Service: Gastroenterology;  Laterality: N/A;  ? ESOPHAGOGASTRODUODENOSCOPY (EGD) WITH PROPOFOL N/A 05/10/2021  ? Procedure: ESOPHAGOGASTRODUODENOSCOPY (EGD) WITH PROPOFOL;  Surgeon: Lynann Bologna, MD;  Location: [facility] ENDOSCOPY;  Service: Gastroenterology;  Laterality: N/A;  ? POLYPECTOMY  05/11/2021  ? Procedure: POLYPECTOMY;  Surgeon: Lynann Bologna, MD;  Location: [facility] ENDOSCOPY;  Service: Gastroenterology;;  ? ? ?Social History  ? ?Socioeconomic History  ? Marital status: Married  ?  Spouse name: Not on file  ? Number of children: Not on file  ? Years of education: Not on file  ? Highest education level: Not on file  ?Occupational History  ? Not on file  ?Tobacco Use  ? Smoking status: Former  ?  Packs/day: 1.00  ?  Types: Cigarettes  ? Smokeless tobacco: Never  ? Tobacco comments:  ?  6 months ago stopped smoking.    ?  Substance and Sexual Activity  ? Alcohol use: Not Currently  ? Drug use: Never  ? Sexual activity: Yes  ?Other Topics Concern  ? Not on file  ?Social History Narrative  ? Not on file  ? ?Social Determinants of Health  ? ?Financial Resource Strain: Not on file  ?Food Insecurity: Not on file  ?Transportation Needs: Not on file  ?Physical Activity: Not on file  ?Stress: Not on file  ?Social Connections: Not on file  ?Intimate Partner Violence: Not on file  ? ? ?No family history on file. ? ? ?Review of Systems   ?Constitutional: Negative.  Negative for chills and fever.  ?HENT: Negative.  Negative for congestion and sore throat.   ?Respiratory: Negative.  Negative for cough and shortness of breath.   ?Cardiovascular: Negative.  Negative for chest pain and palpitations.  ?Gastrointestinal:  Negative for abdominal pain, nausea and vomiting.  ?Genitourinary: Negative.   ?Skin: Negative.  Negative for rash.  ?Neurological:  Negative for dizziness and headaches.  ?All other systems reviewed and are negative. ? ?Today's Vitals  ? 06/16/21 1029  ?BP: 124/84  ?Pulse: 87  ?Temp: 98.2 ?F (36.8 ?C)  ?TempSrc: Oral  ?SpO2: 96%  ?Weight: 242 lb 6 oz (109.9 kg)  ?Height: 6' (1.829 m)  ? ?Body mass index is 32.87 kg/m?. ? ?Physical Exam ?Vitals reviewed.  ?Constitutional:   ?   Appearance: Normal appearance.  ?HENT:  ?   Head: Normocephalic.  ?Eyes:  ?   Extraocular Movements: Extraocular movements intact.  ?   Pupils: Pupils are equal, round, and reactive to light.  ?Cardiovascular:  ?   Rate and Rhythm: Normal rate and regular rhythm.  ?   Pulses: Normal pulses.  ?   Heart sounds: Normal heart sounds.  ?Pulmonary:  ?   Effort: Pulmonary effort is normal.  ?   Breath sounds: Normal breath sounds.  ?Abdominal:  ?   General: There is no distension.  ?   Palpations: Abdomen is soft.  ?   Tenderness: There is no abdominal tenderness.  ?Musculoskeletal:     ?   General: Normal range of motion.  ?   Cervical back: No tenderness.  ?Lymphadenopathy:  ?   Cervical: No cervical adenopathy.  ?Skin: ?   General: Skin is warm and dry.  ?Neurological:  ?   General: No focal deficit present.  ?   Mental Status: He is alert and oriented to person, place, and time.  ?Psychiatric:     ?   Mood and Affect: Mood normal.     ?   Behavior: Behavior normal.  ? ? ? ?ASSESSMENT & PLAN: ?A total of 30 minutes was spent with the patient and counseling/coordination of care regarding preparing for this visit, review of most recent office visit notes, review of most  recent blood work results, review of all medications, occasional nutrition, need for GI follow-up, prognosis, documentation and need for follow-up. ? ?Problem List Items Addressed This Visit   ? ?  ? Digestive  ? Lower GI bleed - Primary  ?  Resolved.  Asymptomatic.  Stable vital signs. ?Last CBC showed improvement of hemoglobin. ?New GI referral placed today. ?  ?  ? Relevant Orders  ? Ambulatory referral to Gastroenterology  ? Peptic ulcer disease  ? Relevant Orders  ? Ambulatory referral to Gastroenterology  ?  ? Other  ? Pancolonic diverticulosis  ? Relevant Orders  ? Ambulatory referral to Gastroenterology  ? ?Patient Instructions  ?Mantenimiento de  la [employer] hombres ?Health Maintenance, Male ?Adoptar un estilo de vida saludable y recibir atenci?n preventiva son importantes para promover la salud y Counsellor. Consulte al m?dico sobre: ?El esquema adecuado para [place] pruebas y ex?menes peri?dicos. ?Cosas que puede hacer por su cuenta para prevenir enfermedades y [place] sano. ??Qu? debo saber sobre la dieta, el peso y el ejercicio? ?Consuma una dieta saludable ? ?Consuma una dieta que incluya muchas verduras, frutas, productos l?cteos con bajo contenido de grasa y prote?nas magras. ?No consuma muchos alimentos ricos en grasas s?lidas, az?cares agregados o sodio. ?Mantenga un peso saludable ?El ?ndice de masa muscular [facility]) es una medida que puede utilizarse para identificar posibles problemas de [place]. Proporciona una estimaci?n de la grasa corporal bas?ndose en el peso y la altura. Su m?dico puede ayudarle a Engineer, site IMC y a Personnel officer o Pharmacologist un peso saludable. ?Haga ejercicio con regularidad ?Haga ejercicio con regularidad. Esta es una de las pr?cticas m?s importantes que puede hacer por su salud. La mayor?a de los adultos deben seguir estas pautas: ?Realizar, al menos, 150 minutos de [place] f?sica por semana. El ejercicio debe aumentar la frecuencia card?aca y Media planner transpirar  (ejercicio de intensidad moderada). ?Hacer ejercicios de fortalecimiento por lo [employer] por semana. Agregue esto a su plan de ejercicio de intensidad moderada. ?Pase menos tiempo sentado. Incluso la actividad f?sica l

## 2021-06-16 NOTE — Assessment & Plan Note (Signed)
Resolved.  Asymptomatic.  Stable vital signs. ?Last CBC showed improvement of hemoglobin. ?New GI referral placed today. ?

## 2021-07-05 DIAGNOSIS — S61201A Unspecified open wound of left index finger without damage to nail, initial encounter: Secondary | ICD-10-CM | POA: Diagnosis not present

## 2021-07-08 ENCOUNTER — Other Ambulatory Visit: Payer: Self-pay | Admitting: Emergency Medicine

## 2021-07-08 DIAGNOSIS — G4700 Insomnia, unspecified: Secondary | ICD-10-CM

## 2021-08-18 ENCOUNTER — Encounter: Payer: Self-pay | Admitting: Emergency Medicine

## 2021-11-30 ENCOUNTER — Encounter: Payer: Self-pay | Admitting: Emergency Medicine

## 2021-11-30 ENCOUNTER — Ambulatory Visit: Payer: BC Managed Care – PPO | Admitting: Emergency Medicine

## 2021-11-30 VITALS — BP 130/86 | HR 101 | Temp 98.3°F | Ht 73.0 in | Wt 234.0 lb

## 2021-11-30 DIAGNOSIS — R6882 Decreased libido: Secondary | ICD-10-CM

## 2021-11-30 DIAGNOSIS — K279 Peptic ulcer, site unspecified, unspecified as acute or chronic, without hemorrhage or perforation: Secondary | ICD-10-CM | POA: Diagnosis not present

## 2021-11-30 DIAGNOSIS — E785 Hyperlipidemia, unspecified: Secondary | ICD-10-CM

## 2021-11-30 DIAGNOSIS — Z789 Other specified health status: Secondary | ICD-10-CM

## 2021-11-30 DIAGNOSIS — K5730 Diverticulosis of large intestine without perforation or abscess without bleeding: Secondary | ICD-10-CM | POA: Diagnosis not present

## 2021-11-30 DIAGNOSIS — F418 Other specified anxiety disorders: Secondary | ICD-10-CM

## 2021-11-30 LAB — COMPREHENSIVE METABOLIC PANEL
ALT: 45 U/L (ref 0–53)
AST: 32 U/L (ref 0–37)
Albumin: 3.7 g/dL (ref 3.5–5.2)
Alkaline Phosphatase: 64 U/L (ref 39–117)
BUN: 11 mg/dL (ref 6–23)
CO2: 28 mEq/L (ref 19–32)
Calcium: 9.5 mg/dL (ref 8.4–10.5)
Chloride: 106 mEq/L (ref 96–112)
Creatinine, Ser: 0.9 mg/dL (ref 0.40–1.50)
GFR: 94.61 mL/min (ref >60.00)
Glucose, Bld: 168 mg/dL — ABNORMAL HIGH (ref 70–99)
Potassium: 4 mEq/L (ref 3.5–5.1)
Sodium: 141 mEq/L (ref 135–145)
Total Bilirubin: 0.4 mg/dL (ref 0.2–1.2)
Total Protein: 6.9 g/dL (ref 6.0–8.3)

## 2021-11-30 LAB — CBC WITH DIFFERENTIAL/PLATELET
Basophils Absolute: 0 10*3/uL (ref 0.0–0.1)
Basophils Relative: 0.4 % (ref 0.0–3.0)
Eosinophils Absolute: 0.1 10*3/uL (ref 0.0–0.7)
Eosinophils Relative: 1.1 % (ref 0.0–5.0)
HCT: 43.4 % (ref 39.0–52.0)
Hemoglobin: 14.3 g/dL (ref 13.0–17.0)
Lymphocytes Relative: 24.9 % (ref 12.0–46.0)
Lymphs Abs: 1.9 10*3/uL (ref 0.7–4.0)
MCHC: 33.1 g/dL (ref 30.0–36.0)
MCV: 82.6 fl (ref 78.0–100.0)
Monocytes Absolute: 0.7 10*3/uL (ref 0.1–1.0)
Monocytes Relative: 8.4 % (ref 3.0–12.0)
Neutro Abs: 5.1 10*3/uL (ref 1.4–7.7)
Neutrophils Relative %: 65.2 % (ref 43.0–77.0)
Platelets: 243 10*3/uL (ref 150.0–400.0)
RBC: 5.25 Mil/uL (ref 4.22–5.81)
RDW: 14 % (ref 11.5–15.5)
WBC: 7.8 10*3/uL (ref 4.0–10.5)

## 2021-11-30 LAB — LIPID PANEL
Cholesterol: 185 mg/dL (ref 0–200)
HDL: 46 mg/dL (ref >39.00)
NonHDL: 139.01
Total CHOL/HDL Ratio: 4
Triglycerides: 247 mg/dL — ABNORMAL HIGH (ref 0.0–149.0)
VLDL: 49.4 mg/dL — ABNORMAL HIGH (ref 0.0–40.0)

## 2021-11-30 LAB — LDL CHOLESTEROL, DIRECT: Direct LDL: 113 mg/dL

## 2021-11-30 LAB — HEMOGLOBIN A1C: Hgb A1c MFr Bld: 6.5 % (ref 4.6–6.5)

## 2021-11-30 LAB — FOLATE: Folate: 18.6 ng/mL (ref >5.9)

## 2021-11-30 MED ORDER — SILDENAFIL CITRATE 100 MG PO TABS: 50.0000 mg | ORAL_TABLET | Freq: Every day | ORAL | 11 refills | Status: AC | PRN

## 2021-11-30 NOTE — Assessment & Plan Note (Signed)
This is his main concern and affecting quality of life. Gets occasional erections but sexual desire is significantly down. Stress and alcohol contributing to this. Wants to try Viagra.

## 2021-11-30 NOTE — Assessment & Plan Note (Signed)
Handling it well.  Stable.

## 2021-11-30 NOTE — Assessment & Plan Note (Signed)
Stable and well-controlled. Takes omeprazole only as needed.

## 2021-11-30 NOTE — Assessment & Plan Note (Signed)
Advised to decrease amount of daily alcohol intake. Dangers of chronic EtOH abuse discussed with patient. Diet and nutrition discussed. This is contributing to his suboptimal overall health.

## 2021-11-30 NOTE — Assessment & Plan Note (Signed)
Stable.  No GI bleed. Advised to increase amount of fiber intake in his diet and stay well-hydrated.

## 2021-11-30 NOTE — Progress Notes (Signed)
Scott Hanna 56 y.o.   Chief Complaint  Patient presents with   Follow-up    No concerns     HISTORY OF PRESENT ILLNESS: This is a 56 y.o. male complaining of decreased libido.  Concerned about testosterone levels. Has been also going through stressful personal situations lately.  Originally from [place]. Still drinking alcohol occasionally.  Non-smoker. History of peptic ulcer disease.  Stable.  No GI bleeding. No other complaints or medical concerns today.  HPI   Prior to Admission medications   Medication Sig Start Date End Date Taking? Authorizing Provider  acetaminophen (TYLENOL) 500 MG tablet Take 1,000 mg by mouth every 6 (six) hours as needed for moderate pain or headache.   Yes [provider]  ferrous sulfate 325 (65 FE) MG tablet Take 1 tablet (325 mg total) by mouth daily. 05/12/21 05/12/22 Yes Regalado, Belkys A, MD  MELATONIN GUMMIES PO Take 20 mg by mouth at bedtime as needed (sleep).   Yes [provider]  Multiple Vitamins-Minerals (CERTAVITE/ANTIOXIDANTS) TABS Take 1 tablet by mouth daily. 05/13/21  Yes Regalado, Belkys A, MD  pantoprazole (PROTONIX) 40 MG tablet Take 1 tablet (40 mg total) by mouth 2 (two) times daily. 05/12/21  Yes Regalado, Belkys A, MD  rosuvastatin (CRESTOR) 20 MG tablet Take 1 tablet (20 mg total) by mouth daily. 08/19/20  Yes Georgina Quint, MD  traZODone (DESYREL) 50 MG tablet TAKE 1/2 TO 1 (ONE-HALF TO ONE) TABLET BY MOUTH AT BEDTIME AS NEEDED FOR SLEEP 07/08/21  Yes Jaryiah Mehlman, Eilleen Kempf, MD  vitamin B-12 (CYANOCOBALAMIN) 250 MCG tablet Take 1 tablet (250 mcg total) by mouth daily. 05/13/21  Yes Alba Cory, MD    Not on File  Patient Active Problem List   Diagnosis Date Noted   Pancolonic diverticulosis 05/18/2021   Peptic ulcer disease 05/18/2021   Alcohol use 05/10/2021   Anxiety 01/10/2021   Dyslipidemia 08/19/2020   Thoracic aortic aneurysm without rupture (HCC) 06/17/2020   Chronic  obstructive pulmonary disease (HCC) 02/05/2020    Past Medical History:  Diagnosis Date   COPD (chronic obstructive pulmonary disease) (HCC)    GI bleed    Thoracic aortic aneurysm without rupture [facility])     Past Surgical History:  Procedure Laterality Date   BIOPSY  05/10/2021   Procedure: BIOPSY;  Surgeon: Lynann Bologna, MD;  Location: [facility] ENDOSCOPY;  Service: Gastroenterology;;   COLONOSCOPY WITH PROPOFOL N/A 05/11/2021   Procedure: COLONOSCOPY WITH PROPOFOL;  Surgeon: Lynann Bologna, MD;  Location: [facility] ENDOSCOPY;  Service: Gastroenterology;  Laterality: N/A;   ESOPHAGOGASTRODUODENOSCOPY (EGD) WITH PROPOFOL N/A 05/10/2021   Procedure: ESOPHAGOGASTRODUODENOSCOPY (EGD) WITH PROPOFOL;  Surgeon: Lynann Bologna, MD;  Location: [facility] ENDOSCOPY;  Service: Gastroenterology;  Laterality: N/A;   POLYPECTOMY  05/11/2021   Procedure: POLYPECTOMY;  Surgeon: Lynann Bologna, MD;  Location: [facility] ENDOSCOPY;  Service: Gastroenterology;;    Social History   Socioeconomic History   Marital status: Married    Spouse name: Not on file   Number of children: Not on file   Years of education: Not on file   Highest education level: Not on file  Occupational History   Not on file  Tobacco Use   Smoking status: Former    Packs/day: 1.00    Types: Cigarettes   Smokeless tobacco: Never   Tobacco comments:    6 months ago stopped smoking.    Substance and Sexual Activity   Alcohol use: Not Currently   Drug use: Never   Sexual  activity: Yes  Other Topics Concern   Not on file  Social History Narrative   Not on file   Social Determinants of Health   Financial Resource Strain: Not on file  Food Insecurity: Not on file  Transportation Needs: Not on file  Physical Activity: Not on file  Stress: Not on file  Social Connections: Not on file  Intimate Partner Violence: Not on file    History reviewed. No pertinent family history.   Review of Systems  Constitutional: Negative.  Negative for chills and  fever.  HENT: Negative.  Negative for congestion and sore throat.   Respiratory: Negative.  Negative for cough and shortness of breath.   Cardiovascular: Negative.  Negative for chest pain and palpitations.  Gastrointestinal:  Negative for abdominal pain, diarrhea, nausea and vomiting.  Genitourinary: Negative.   Skin: Negative.  Negative for rash.  Neurological:  Negative for dizziness and headaches.  Psychiatric/Behavioral:  Positive for depression. The patient is nervous/anxious.   All other systems reviewed and are negative.  Today's Vitals   11/30/21 1421  BP: 130/86  Pulse: (!) 101  Temp: 98.3 F (36.8 C)  TempSrc: Oral  SpO2: 94%  Weight: 234 lb (106.1 kg)  Height: 6\' 1"  (1.854 m)   Body mass index is 30.87 kg/m. Wt Readings from Last 3 Encounters:  11/30/21 234 lb (106.1 kg)  06/16/21 242 lb 6 oz (109.9 kg)  05/18/21 245 lb (111.1 kg)     Physical Exam Vitals reviewed.  Constitutional:      Appearance: Normal appearance.  HENT:     Head: Normocephalic.  Eyes:     Extraocular Movements: Extraocular movements intact.     Conjunctiva/sclera: Conjunctivae normal.     Pupils: Pupils are equal, round, and reactive to light.  Cardiovascular:     Rate and Rhythm: Normal rate and regular rhythm.     Pulses: Normal pulses.     Heart sounds: Normal heart sounds.  Pulmonary:     Effort: Pulmonary effort is normal.     Breath sounds: Normal breath sounds.  Abdominal:     Palpations: Abdomen is soft.     Tenderness: There is no abdominal tenderness.  Musculoskeletal:     Cervical back: No tenderness.     Right lower leg: No edema.     Left lower leg: No edema.  Lymphadenopathy:     Cervical: No cervical adenopathy.  Skin:    General: Skin is warm and dry.     Capillary Refill: Capillary refill takes less than 2 seconds.  Neurological:     General: No focal deficit present.     Mental Status: He is alert and oriented to person, place, and time.  Psychiatric:         Mood and Affect: Mood normal.        Behavior: Behavior normal.      ASSESSMENT & PLAN: A total of 47 minutes was spent with the patient and counseling/coordination of care regarding preparing for this visit, review of most recent office visit notes, review of multiple chronic medical problems under management, review of all medications, effects of chronic EtOH use, education on nutrition, need for blood work today, prognosis, documentation, and need for follow-up.  Problem List Items Addressed This Visit       Digestive   Peptic ulcer disease    Stable and well-controlled. Takes omeprazole only as needed.      Relevant Orders   CBC with Differential/Platelet  Other   Dyslipidemia    Stable.  Diet and nutrition discussed. Continue rosuvastatin 20 mg daily. The 10-year ASCVD risk score (Arnett DK, et al., 2019) is: 11.3%   Values used to calculate the score:     Age: 54 years     Sex: Male     Is Non-Hispanic African American: No     Diabetic: No     Tobacco smoker: No     Systolic Blood Pressure: 130 mmHg     Is BP treated: No     HDL Cholesterol: 32 mg/dL     Total Cholesterol: 230 mg/dL       Relevant Orders   Lipid panel   Situational anxiety    Handling it well.  Stable.      Alcohol use    Advised to decrease amount of daily alcohol intake. Dangers of chronic EtOH abuse discussed with patient. Diet and nutrition discussed. This is contributing to his suboptimal overall health.      Pancolonic diverticulosis    Stable.  No GI bleed. Advised to increase amount of fiber intake in his diet and stay well-hydrated.      Relevant Orders   Hemoglobin A1c   Decreased libido without sexual dysfunction - Primary    This is his main concern and affecting quality of life. Gets occasional erections but sexual desire is significantly down. Stress and alcohol contributing to this. Wants to try Viagra.      Relevant Medications   sildenafil (VIAGRA) 100  MG tablet   Other Relevant Orders   TestT+TestF+SHBG   CBC with Differential/Platelet   Comprehensive metabolic panel   Hemoglobin A1c   Folate   Patient Instructions  Mantenimiento de Radiographer, therapeutic en los hombres Health Maintenance, Male Adoptar un estilo de vida saludable y recibir atencin preventiva son importantes para promover la salud y Counsellor. Consulte al mdico sobre: El esquema adecuado para hacerse pruebas y exmenes peridicos. Cosas que puede hacer por su cuenta para prevenir enfermedades y [place] sano. Qu debo saber sobre la dieta, el peso y el ejercicio? Consuma una dieta saludable  Consuma una dieta que incluya muchas verduras, frutas, productos lcteos con bajo contenido de [place] y Associate Professor. No consuma muchos alimentos ricos en grasas slidas, azcares agregados o sodio. Mantenga un peso saludable El ndice de masa muscular [facility]) es una medida que puede utilizarse para identificar posibles problemas de [place]. Proporciona una estimacin de la grasa corporal basndose en el peso y la altura. Su mdico puede ayudarle a Engineer, site IMC y a Personnel officer o Pharmacologist un peso saludable. Haga ejercicio con regularidad Haga ejercicio con regularidad. Esta es una de las prcticas ms importantes que puede hacer por su salud. La [employer] de los adultos deben seguir estas pautas: Education officer, environmental, al menos, 150 minutos de actividad fsica por semana. El ejercicio debe aumentar la frecuencia cardaca y Media planner transpirar (ejercicio de intensidad moderada). Hacer ejercicios de fortalecimiento por lo [employer] por semana. Agregue esto a su plan de ejercicio de intensidad moderada. Pase menos tiempo sentado. Incluso la actividad fsica ligera puede ser beneficiosa. Controle sus niveles de colesterol y lpidos en la sangre Comience a realizarse anlisis de lpidos y Oncologist en la sangre a los 20 aos y luego reptalos cada 5 aos. Es posible que Insurance underwriter los niveles de  colesterol con mayor frecuencia si: Sus niveles de lpidos y colesterol son altos. Es mayor de 40 aos. Presenta un alto riesgo de padecer enfermedades cardacas.  Qu debo saber sobre las pruebas de deteccin del cncer? Muchos tipos de cncer pueden detectarse de manera temprana y, a menudo, pueden prevenirse. Segn su historia clnica y sus antecedentes familiares, es posible que deba realizarse pruebas de deteccin del cncer en diferentes edades. Esto puede incluir pruebas de deteccin de lo siguiente: Building services engineer. Cncer de prstata. Cncer de piel. Cncer de pulmn. Qu debo saber sobre la enfermedad cardaca, la diabetes y la hipertensin arterial? Presin arterial y enfermedad cardaca La hipertensin arterial causa enfermedades cardacas y [place] el riesgo de accidente cerebrovascular. Es ms probable que esto se manifieste en las personas que tienen lecturas de presin arterial alta o tienen sobrepeso. Hable con el mdico sobre sus valores de presin arterial deseados. Hgase controlar la presin arterial: Cada 3 a 5 aos si tiene entre 18 y 41 aos. Todos los aos si es mayor de 40 aos. Si tiene entre 65 y 17 aos y es fumador o Insurance underwriter, pregntele al mdico si debe realizarse una prueba de deteccin de aneurisma artico abdominal (AAA) por nica vez. Diabetes Realcese exmenes de deteccin de la diabetes con regularidad. Este anlisis revisa el nivel de azcar en la sangre en [place]. Hgase las pruebas de deteccin: Cada tres aos despus de los 45 aos de edad si tiene un peso normal y un bajo riesgo de padecer diabetes. Con ms frecuencia y a partir de [place] edad inferior si tiene sobrepeso o un alto riesgo de padecer diabetes. Qu debo saber sobre la prevencin de infecciones? Hepatitis B Si tiene un riesgo ms alto de contraer hepatitis B, debe someterse a un examen de deteccin de este virus. Hable con el mdico para averiguar si tiene riesgo de contraer la  infeccin por hepatitis B. Hepatitis C Se recomienda un anlisis de [place] para: Todos los que nacieron entre 1945 y [phone]. Todas las personas que tengan un riesgo de haber contrado hepatitis C. Enfermedades de transmisin sexual (ETS) Debe realizarse pruebas de deteccin de ITS todos los aos, incluidas la gonorrea y la clamidia, si: Es sexualmente activo y es menor de [address]. Es mayor de [address], y Public affairs consultant informa que corre riesgo de tener este tipo de infecciones. La actividad sexual ha cambiado desde que le hicieron la ltima prueba de deteccin y tiene un riesgo mayor de Warehouse manager clamidia o Copy. Pregntele al mdico si usted tiene riesgo. Pregntele al mdico si usted tiene un alto riesgo de Primary school teacher VIH. El mdico tambin puede recomendarle un medicamento recetado para ayudar a evitar la infeccin por el VIH. Si elige tomar medicamentos para prevenir el VIH, primero debe [employer] de deteccin del VIH. Luego debe hacerse anlisis cada 3 meses mientras est tomando los medicamentos. Siga estas indicaciones en su casa: Consumo de alcohol No beba alcohol si el mdico se lo prohbe. Si bebe alcohol: Limite la cantidad que consume de 0 a 2 bebidas por da. Sepa cunta cantidad de alcohol hay en las bebidas que toma. En los [address], una medida equivale a una botella de cerveza de 12 oz (355 ml), un vaso de vino de 5 oz (148 ml) o un vaso de una bebida alcohlica de alta graduacin de 1 oz (44 ml). Estilo de vida No consuma ningn producto que contenga nicotina o tabaco. Estos productos incluyen cigarrillos, tabaco para Theatre manager y aparatos de vapeo, como los Administrator, Civil Service. Si necesita ayuda para dejar de consumir estos productos, consulte al mdico. No consuma drogas. No comparta agujas. Solicite [employer]  a su mdico si necesita apoyo o informacin para abandonar las drogas. Indicaciones generales Realcese los estudios de rutina de [address] salud, dentales y de Research scientist (medical)la  vista. Mantngase al da con las vacunas. Infrmele a su mdico si: Se siente deprimido con frecuencia. Alguna vez ha sido vctima de [place] o no se siente seguro en su casa. Resumen Adoptar un estilo de vida saludable y recibir atencin preventiva son importantes para promover la salud y Counsellorel bienestar. Siga las instrucciones del mdico acerca de una dieta saludable, el ejercicio y la realizacin de pruebas o exmenes para Hotel managerdetectar enfermedades. Siga las instrucciones del mdico con respecto al control del colesterol y la presin arterial. Esta informacin no tiene Theme park managercomo fin reemplazar el consejo del mdico. Asegrese de hacerle al mdico cualquier pregunta que tenga. Document Revised: 08/11/2020 Document Reviewed: 08/11/2020 Elsevier Patient Education  2023 Elsevier Inc.    Edwina BarthMiguel Cattie Tineo, MD [facility]

## 2021-11-30 NOTE — Patient Instructions (Signed)
Mantenimiento de la salud en los hombres Health Maintenance, Male Adoptar un estilo de vida saludable y recibir atencin preventiva son importantes para promover la salud y el bienestar. Consulte al mdico sobre: El esquema adecuado para hacerse pruebas y exmenes peridicos. Cosas que puede hacer por su cuenta para prevenir enfermedades y mantenerse sano. Qu debo saber sobre la dieta, el peso y el ejercicio? Consuma una dieta saludable  Consuma una dieta que incluya muchas verduras, frutas, productos lcteos con bajo contenido de grasa y protenas magras. No consuma muchos alimentos ricos en grasas slidas, azcares agregados o sodio. Mantenga un peso saludable El ndice de masa muscular (IMC) es una medida que puede utilizarse para identificar posibles problemas de peso. Proporciona una estimacin de la grasa corporal basndose en el peso y la altura. Su mdico puede ayudarle a determinar su IMC y a lograr o mantener un peso saludable. Haga ejercicio con regularidad Haga ejercicio con regularidad. Esta es una de las prcticas ms importantes que puede hacer por su salud. La mayora de los adultos deben seguir estas pautas: Realizar, al menos, 150 minutos de actividad fsica por semana. El ejercicio debe aumentar la frecuencia cardaca y hacerlo transpirar (ejercicio de intensidad moderada). Hacer ejercicios de fortalecimiento por lo menos dos veces por semana. Agregue esto a su plan de ejercicio de intensidad moderada. Pase menos tiempo sentado. Incluso la actividad fsica ligera puede ser beneficiosa. Controle sus niveles de colesterol y lpidos en la sangre Comience a realizarse anlisis de lpidos y colesterol en la sangre a los 20 aos y luego reptalos cada 5 aos. Es posible que necesite controlar los niveles de colesterol con mayor frecuencia si: Sus niveles de lpidos y colesterol son altos. Es mayor de 40 aos. Presenta un alto riesgo de padecer enfermedades cardacas. Qu debo  saber sobre las pruebas de deteccin del cncer? Muchos tipos de cncer pueden detectarse de manera temprana y, a menudo, pueden prevenirse. Segn su historia clnica y sus antecedentes familiares, es posible que deba realizarse pruebas de deteccin del cncer en diferentes edades. Esto puede incluir pruebas de deteccin de lo siguiente: Cncer colorrectal. Cncer de prstata. Cncer de piel. Cncer de pulmn. Qu debo saber sobre la enfermedad cardaca, la diabetes y la hipertensin arterial? Presin arterial y enfermedad cardaca La hipertensin arterial causa enfermedades cardacas y aumenta el riesgo de accidente cerebrovascular. Es ms probable que esto se manifieste en las personas que tienen lecturas de presin arterial alta o tienen sobrepeso. Hable con el mdico sobre sus valores de presin arterial deseados. Hgase controlar la presin arterial: Cada 3 a 5 aos si tiene entre 18 y 39 aos. Todos los aos si es mayor de 40 aos. Si tiene entre 65 y 75 aos y es fumador o sola fumar, pregntele al mdico si debe realizarse una prueba de deteccin de aneurisma artico abdominal (AAA) por nica vez. Diabetes Realcese exmenes de deteccin de la diabetes con regularidad. Este anlisis revisa el nivel de azcar en la sangre en ayunas. Hgase las pruebas de deteccin: Cada tres aos despus de los 45 aos de edad si tiene un peso normal y un bajo riesgo de padecer diabetes. Con ms frecuencia y a partir de una edad inferior si tiene sobrepeso o un alto riesgo de padecer diabetes. Qu debo saber sobre la prevencin de infecciones? Hepatitis B Si tiene un riesgo ms alto de contraer hepatitis B, debe someterse a un examen de deteccin de este virus. Hable con el mdico para averiguar si tiene riesgo de   contraer la infeccin por hepatitis B. Hepatitis C Se recomienda un anlisis de sangre para: Todos los que nacieron entre 1945 y 1965. Todas las personas que tengan un riesgo de haber  contrado hepatitis C. Enfermedades de transmisin sexual (ETS) Debe realizarse pruebas de deteccin de ITS todos los aos, incluidas la gonorrea y la clamidia, si: Es sexualmente activo y es menor de 24 aos. Es mayor de 24 aos, y el mdico le informa que corre riesgo de tener este tipo de infecciones. La actividad sexual ha cambiado desde que le hicieron la ltima prueba de deteccin y tiene un riesgo mayor de tener clamidia o gonorrea. Pregntele al mdico si usted tiene riesgo. Pregntele al mdico si usted tiene un alto riesgo de contraer VIH. El mdico tambin puede recomendarle un medicamento recetado para ayudar a evitar la infeccin por el VIH. Si elige tomar medicamentos para prevenir el VIH, primero debe hacerse los anlisis de deteccin del VIH. Luego debe hacerse anlisis cada 3 meses mientras est tomando los medicamentos. Siga estas indicaciones en su casa: Consumo de alcohol No beba alcohol si el mdico se lo prohbe. Si bebe alcohol: Limite la cantidad que consume de 0 a 2 bebidas por da. Sepa cunta cantidad de alcohol hay en las bebidas que toma. En los [place], una medida equivale a una botella de cerveza de 12 oz (355 ml), un vaso de vino de 5 oz (148 ml) o un vaso de una bebida alcohlica de alta graduacin de 1 oz (44 ml). Estilo de vida No consuma ningn producto que contenga nicotina o tabaco. Estos productos incluyen cigarrillos, tabaco para mascar y aparatos de vapeo, como los cigarrillos electrnicos. Si necesita ayuda para dejar de consumir estos productos, consulte al mdico. No consuma drogas. No comparta agujas. Solicite ayuda a su mdico si necesita apoyo o informacin para abandonar las drogas. Indicaciones generales Realcese los estudios de rutina de la salud, dentales y de la vista. Mantngase al da con las vacunas. Infrmele a su mdico si: Se siente deprimido con frecuencia. Alguna vez ha sido vctima de maltrato o no se siente seguro en su  casa. Resumen Adoptar un estilo de vida saludable y recibir atencin preventiva son importantes para promover la salud y el bienestar. Siga las instrucciones del mdico acerca de una dieta saludable, el ejercicio y la realizacin de pruebas o exmenes para detectar enfermedades. Siga las instrucciones del mdico con respecto al control del colesterol y la presin arterial. Esta informacin no tiene como fin reemplazar el consejo del mdico. Asegrese de hacerle al mdico cualquier pregunta que tenga. Document Revised: 08/11/2020 Document Reviewed: 08/11/2020 Elsevier Patient Education  2023 Elsevier Inc.  

## 2021-11-30 NOTE — Assessment & Plan Note (Signed)
Stable.  Diet and nutrition discussed. Continue rosuvastatin 20 mg daily. The 10-year ASCVD risk score (Arnett DK, et al., 2019) is: 11.3%   Values used to calculate the score:     Age: 56 years     Sex: Male     Is Non-Hispanic African American: No     Diabetic: No     Tobacco smoker: No     Systolic Blood Pressure: 130 mmHg     Is BP treated: No     HDL Cholesterol: 32 mg/dL     Total Cholesterol: 230 mg/dL

## 2021-12-05 LAB — TESTT+TESTF+SHBG
Sex Hormone Binding: 25.9 nmol/L (ref 19.3–76.4)
Testosterone, Free: 3.2 pg/mL — ABNORMAL LOW (ref 7.2–24.0)
Testosterone, Total, LC/MS: 163.8 ng/dL — ABNORMAL LOW (ref 264.0–916.0)

## 2021-12-06 ENCOUNTER — Other Ambulatory Visit: Payer: Self-pay | Admitting: Emergency Medicine

## 2021-12-06 DIAGNOSIS — E291 Testicular hypofunction: Secondary | ICD-10-CM

## 2021-12-06 MED ORDER — TESTOSTERONE 1.62 % TD GEL: 1.0000 | Freq: Every day | TRANSDERMAL | 3 refills | Status: AC

## 2021-12-07 ENCOUNTER — Telehealth: Payer: Self-pay | Admitting: *Deleted

## 2021-12-07 DIAGNOSIS — R6882 Decreased libido: Secondary | ICD-10-CM

## 2021-12-07 NOTE — Telephone Encounter (Signed)
PA for Testosterone gel submitted, awaiting response  Key: [id number]

## 2021-12-09 NOTE — Telephone Encounter (Addendum)
PA for testosterone gel denied,

## 2021-12-09 NOTE — Telephone Encounter (Signed)
Okay to repeat morning testosterone levels.  Thanks.

## 2021-12-09 NOTE — Telephone Encounter (Signed)
Per insurance PA denied due to not having 2 low testosterone labs done in the am. No lab results to send in   Please advise if labs need to be order, an appeal can be started after labs are done

## 2021-12-13 NOTE — Telephone Encounter (Signed)
Called patient with interpreter on the phone in reference to lab work patient will come in Monday am and Tuesday am to get necessary labs drawn per insurance requirement. Will contact patient once the appeal has been processed.

## 2021-12-13 NOTE — Addendum Note (Signed)
Addended by: Rae Mar on: 12/13/2021 02:13 PM   Modules accepted: Orders

## 2021-12-19 ENCOUNTER — Other Ambulatory Visit: Payer: BC Managed Care – PPO

## 2021-12-19 DIAGNOSIS — R6882 Decreased libido: Secondary | ICD-10-CM | POA: Diagnosis not present

## 2021-12-25 LAB — TESTT+TESTF+SHBG
Sex Hormone Binding: 23.9 nmol/L (ref 19.3–76.4)
Testosterone, Free: 4 pg/mL — ABNORMAL LOW (ref 7.2–24.0)
Testosterone, Total, LC/MS: 234.3 ng/dL — ABNORMAL LOW (ref 264.0–916.0)

## 2021-12-26 NOTE — Progress Notes (Signed)
Continue PA process for testosterone gel.  Now we have to low testosterone levels.  Thanks.

## 2021-12-26 NOTE — Progress Notes (Signed)
Meant to say that now we have two(2) low testosterone levels on record.

## 2021-12-28 NOTE — Progress Notes (Signed)
Thanks

## 2022-01-02 NOTE — Telephone Encounter (Signed)
Patient can come back and get testosterone levels done in early morning

## 2022-01-02 NOTE — Telephone Encounter (Signed)
PA denied for testosterone gel due to labs not being done in the early morning. Patient was aware to come in to get labs done early. Please advise

## 2022-01-03 NOTE — Addendum Note (Signed)
Addended by: Rae Mar on: 01/03/2022 09:20 AM   Modules accepted: Orders

## 2022-01-03 NOTE — Telephone Encounter (Signed)
Called patient with interpreter on the phone to inform patient to come back in to get lab work done in order to get medication approved. Orders for labs has been placed.

## 2022-01-10 ENCOUNTER — Other Ambulatory Visit: Payer: BC Managed Care – PPO

## 2022-01-10 DIAGNOSIS — R6882 Decreased libido: Secondary | ICD-10-CM

## 2022-01-11 DIAGNOSIS — R6882 Decreased libido: Secondary | ICD-10-CM | POA: Diagnosis not present

## 2022-01-13 LAB — TESTT+TESTF+SHBG
Sex Hormone Binding: 32.4 nmol/L (ref 19.3–76.4)
Testosterone, Free: 5.3 pg/mL — ABNORMAL LOW (ref 7.2–24.0)
Testosterone, Total, LC/MS: 253.9 ng/dL — ABNORMAL LOW (ref 264.0–916.0)

## 2022-01-14 LAB — TESTT+TESTF+SHBG
Sex Hormone Binding: 32.6 nmol/L (ref 19.3–76.4)
Testosterone, Free: 8.8 pg/mL (ref 7.2–24.0)
Testosterone, Total, LC/MS: 235.1 ng/dL — ABNORMAL LOW (ref 264.0–916.0)

## 2022-08-03 ENCOUNTER — Encounter: Payer: Self-pay | Admitting: Emergency Medicine

## 2022-08-03 ENCOUNTER — Ambulatory Visit: Payer: BC Managed Care – PPO | Admitting: Emergency Medicine

## 2022-08-03 ENCOUNTER — Ambulatory Visit (INDEPENDENT_AMBULATORY_CARE_PROVIDER_SITE_OTHER): Payer: BC Managed Care – PPO

## 2022-08-03 VITALS — BP 160/100 | HR 100 | Temp 98.3°F | Ht 73.0 in | Wt 218.2 lb

## 2022-08-03 DIAGNOSIS — I10 Essential (primary) hypertension: Secondary | ICD-10-CM | POA: Insufficient documentation

## 2022-08-03 DIAGNOSIS — F17200 Nicotine dependence, unspecified, uncomplicated: Secondary | ICD-10-CM

## 2022-08-03 DIAGNOSIS — M545 Low back pain, unspecified: Secondary | ICD-10-CM | POA: Insufficient documentation

## 2022-08-03 DIAGNOSIS — Z1159 Encounter for screening for other viral diseases: Secondary | ICD-10-CM

## 2022-08-03 DIAGNOSIS — M5136 Other intervertebral disc degeneration, lumbar region: Secondary | ICD-10-CM | POA: Diagnosis not present

## 2022-08-03 DIAGNOSIS — M549 Dorsalgia, unspecified: Secondary | ICD-10-CM | POA: Insufficient documentation

## 2022-08-03 LAB — POCT URINALYSIS DIPSTICK
Bilirubin, UA: NEGATIVE
Glucose, UA: NEGATIVE
Ketones, UA: NEGATIVE
Leukocytes, UA: NEGATIVE
Nitrite, UA: NEGATIVE
Protein, UA: POSITIVE — AB
Spec Grav, UA: 1.015 (ref 1.010–1.025)
Urobilinogen, UA: 0.2 E.U./dL
pH, UA: 6 (ref 5.0–8.0)

## 2022-08-03 MED ORDER — VALSARTAN-HYDROCHLOROTHIAZIDE 160-12.5 MG PO TABS: 1.0000 | ORAL_TABLET | Freq: Every day | ORAL | 3 refills | Status: DC

## 2022-08-03 NOTE — Assessment & Plan Note (Signed)
Negative urinalysis Musculoskeletal and mechanical in nature Pain management discussed No red flag signs or symptoms

## 2022-08-03 NOTE — Assessment & Plan Note (Signed)
Cardiovascular and cancer risks associated with smoking discussed. °Smoking cessation advice given. °

## 2022-08-03 NOTE — Progress Notes (Signed)
Scott Hanna 57 y.o.   Chief Complaint  Patient presents with   Back Pain    Patient is having lower back pain, patient states he has experience pain in his testicle     HISTORY OF PRESENT ILLNESS: This is a 57 y.o. male complaining of low back pain with some radiation to the testicle that happened yesterday.  Gone today. Asymptomatic at present time. Has remote history of kidney stones. Started to smoke again. No other associated symptoms  Back Pain Pertinent negatives include no abdominal pain, chest pain, fever or headaches.     Prior to Admission medications   Medication Sig Start Date End Date Taking? Authorizing Provider  acetaminophen (TYLENOL) 500 MG tablet Take 1,000 mg by mouth every 6 (six) hours as needed for moderate pain or headache.   Yes [provider]  MELATONIN GUMMIES PO Take 20 mg by mouth at bedtime as needed (sleep).   Yes [provider]  Multiple Vitamins-Minerals (CERTAVITE/ANTIOXIDANTS) TABS Take 1 tablet by mouth daily. 05/13/21  Yes Regalado, Belkys A, MD  pantoprazole (PROTONIX) 40 MG tablet Take 1 tablet (40 mg total) by mouth 2 (two) times daily. 05/12/21  Yes Regalado, Belkys A, MD  sildenafil (VIAGRA) 100 MG tablet Take 0.5-1 tablets (50-100 mg total) by mouth daily as needed for erectile dysfunction. 11/30/21  Yes SagardiaEilleen Kempf, MD  Testosterone 1.62 % GEL Place 1 Application onto the skin daily. 12/06/21  Yes Georgina Quint, MD  traZODone (DESYREL) 50 MG tablet TAKE 1/2 TO 1 (ONE-HALF TO ONE) TABLET BY MOUTH AT BEDTIME AS NEEDED FOR SLEEP 07/08/21  Yes Kathaleya Mcduffee, Eilleen Kempf, MD  vitamin B-12 (CYANOCOBALAMIN) 250 MCG tablet Take 1 tablet (250 mcg total) by mouth daily. 05/13/21  Yes Regalado, Belkys A, MD  ferrous sulfate 325 (65 FE) MG tablet Take 1 tablet (325 mg total) by mouth daily. 05/12/21 05/12/22  Regalado, Jon Billings A, MD  rosuvastatin (CRESTOR) 20 MG tablet Take 1 tablet (20 mg total) by mouth  daily. Patient not taking: Reported on 08/03/2022 08/19/20   Georgina Quint, MD    No Known Allergies  Patient Active Problem List   Diagnosis Date Noted   Decreased libido without sexual dysfunction 11/30/2021   Pancolonic diverticulosis 05/18/2021   Peptic ulcer disease 05/18/2021   Alcohol use 05/10/2021   Situational anxiety 01/10/2021   Dyslipidemia 08/19/2020   Thoracic aortic aneurysm without rupture (HCC) 06/17/2020   Chronic obstructive pulmonary disease (HCC) 02/05/2020    Past Medical History:  Diagnosis Date   COPD (chronic obstructive pulmonary disease) (HCC)    GI bleed    Thoracic aortic aneurysm without rupture [facility])     Past Surgical History:  Procedure Laterality Date   BIOPSY  05/10/2021   Procedure: BIOPSY;  Surgeon: Lynann Bologna, MD;  Location: [facility] ENDOSCOPY;  Service: Gastroenterology;;   COLONOSCOPY WITH PROPOFOL N/A 05/11/2021   Procedure: COLONOSCOPY WITH PROPOFOL;  Surgeon: Lynann Bologna, MD;  Location: [facility] ENDOSCOPY;  Service: Gastroenterology;  Laterality: N/A;   ESOPHAGOGASTRODUODENOSCOPY (EGD) WITH PROPOFOL N/A 05/10/2021   Procedure: ESOPHAGOGASTRODUODENOSCOPY (EGD) WITH PROPOFOL;  Surgeon: Lynann Bologna, MD;  Location: [facility] ENDOSCOPY;  Service: Gastroenterology;  Laterality: N/A;   POLYPECTOMY  05/11/2021   Procedure: POLYPECTOMY;  Surgeon: Lynann Bologna, MD;  Location: [facility] ENDOSCOPY;  Service: Gastroenterology;;    Social History   Socioeconomic History   Marital status: Married    Spouse name: Not on file   Number of children: Not on file   Years  of education: Not on file   Highest education level: Not on file  Occupational History   Not on file  Tobacco Use   Smoking status: Former    Packs/day: 1    Types: Cigarettes   Smokeless tobacco: Never   Tobacco comments:    6 months ago stopped smoking.    Substance and Sexual Activity   Alcohol use: Not Currently   Drug use: Never   Sexual activity: Yes  Other Topics Concern   Not on  file  Social History Narrative   Not on file   Social Determinants of Health   Financial Resource Strain: Not on file  Food Insecurity: Not on file  Transportation Needs: Not on file  Physical Activity: Not on file  Stress: Not on file  Social Connections: Not on file  Intimate Partner Violence: Not on file    No family history on file.   Review of Systems  Constitutional: Negative.  Negative for chills and fever.  HENT: Negative.  Negative for congestion and sore throat.   Respiratory: Negative.  Negative for cough and shortness of breath.   Cardiovascular: Negative.  Negative for chest pain and palpitations.  Gastrointestinal:  Negative for abdominal pain, diarrhea, nausea and vomiting.  Musculoskeletal:  Positive for back pain.  Skin: Negative.  Negative for rash.  Neurological: Negative.  Negative for dizziness and headaches.  All other systems reviewed and are negative.  Today's Vitals   08/03/22 1545  BP: (!) 150/98  Pulse: 100  Temp: 98.3 F (36.8 C)  TempSrc: Oral  SpO2: 98%  Weight: 218 lb 4 oz (99 kg)  Height: 6\' 1"  (1.854 m)   Body mass index is 28.79 kg/m.    Physical Exam Vitals reviewed.  Constitutional:      Appearance: Normal appearance.  HENT:     Head: Normocephalic.     Mouth/Throat:     Mouth: Mucous membranes are moist.     Pharynx: Oropharynx is clear.  Eyes:     Extraocular Movements: Extraocular movements intact.     Pupils: Pupils are equal, round, and reactive to light.  Cardiovascular:     Rate and Rhythm: Normal rate and regular rhythm.     Pulses: Normal pulses.     Heart sounds: Normal heart sounds.  Pulmonary:     Effort: Pulmonary effort is normal.     Breath sounds: Normal breath sounds.  Abdominal:     Palpations: Abdomen is soft.     Tenderness: There is no abdominal tenderness. There is no right CVA tenderness or left CVA tenderness.     Hernia: There is no hernia in the left inguinal area or right inguinal area.   Genitourinary:    Penis: Uncircumcised.      Testes:        Right: Tenderness not present.        Left: Tenderness not present.  Musculoskeletal:        General: Normal range of motion.     Cervical back: No tenderness.  Lymphadenopathy:     Cervical: No cervical adenopathy.     Lower Body: No right inguinal adenopathy. No left inguinal adenopathy.  Skin:    General: Skin is warm and dry.     Capillary Refill: Capillary refill takes less than 2 seconds.  Neurological:     General: No focal deficit present.     Mental Status: He is alert.     Sensory: No sensory deficit.  Motor: No weakness.  Psychiatric:        Mood and Affect: Mood normal.        Behavior: Behavior normal.    Results for orders placed or performed in visit on 08/03/22 (from the past 24 hour(s))  POCT Urinalysis Dipstick     Status: Abnormal   Collection Time: 08/03/22  4:21 PM  Result Value Ref Range   Color, UA yellow    Clarity, UA clear    Glucose, UA Negative Negative   Bilirubin, UA negative    Ketones, UA negative    Spec Grav, UA 1.015 1.010 - 1.025   Blood, UA 1+    pH, UA 6.0 5.0 - 8.0   Protein, UA Positive (A) Negative   Urobilinogen, UA 0.2 0.2 or 1.0 E.U./dL   Nitrite, UA negative    Leukocytes, UA Negative Negative   Appearance clear    Odor no    DG Lumbar Spine 2-3 Views  Result Date: 08/03/2022 CLINICAL DATA:  Lumbar spine pain EXAM: LUMBAR SPINE - 2-3 VIEW COMPARISON:  May 11, 2021. FINDINGS: There are 4 non-rib bearing lumbar-type vertebral bodies. There is normal alignment. There is no evidence for acute fracture or subluxation. There is mild intervertebral disc space height loss at L3-4. Lower lumbar facet arthropathy. Visualized abdomen is unremarkable. IMPRESSION: Mild degenerative changes of the lumbar spine. If persistent concern for acute fracture, recommend dedicated cross-sectional imaging. Electronically Signed   By: Meda Klinefelter M.D.   On: 08/03/2022 16:31      ASSESSMENT & PLAN: A total of 44 minutes was spent with the patient and counseling/coordination of care regarding preparing for this visit, review of most recent office visit notes, diagnosis of hypertension and cardiovascular risk associated with uncontrolled hypertension, need to start antihypertensive medication, education on nutrition, cardiovascular risks associated with smoking, smoking cessation advised, need for blood work today, review of x-ray report done today, review of urinalysis, prognosis, documentation and need for follow-up in 4 weeks.  Problem List Items Addressed This Visit       Cardiovascular and Mediastinum   Uncontrolled hypertension - Primary    Elevated blood pressure readings at home and in the office Recommend to start valsartan HCT 160-12.5 mg daily Cardiovascular risks associated with uncontrolled hypertension discussed Dietary approaches to stop hypertension discussed Smoking cessation advice given Benefits of exercise discussed Advised to monitor blood pressure readings at home daily for the next several weeks and keep a log.  Advised to contact the office if numbers persistently abnormal.  Will follow-up in 4 weeks      Relevant Medications   valsartan-hydrochlorothiazide (DIOVAN-HCT) 160-12.5 MG tablet   Other Relevant Orders   CBC with Differential/Platelet   Comprehensive metabolic panel   Hemoglobin A1c   Lipid panel     Other   Musculoskeletal back pain    Unremarkable x-ray report Mechanical in nature Physical examination.  No red flag signs or symptoms. Recommend Tylenol as needed for pain Advised to avoid NSAIDs       Acute bilateral low back pain without sciatica    Negative urinalysis Musculoskeletal and mechanical in nature Pain management discussed No red flag signs or symptoms      Relevant Orders   DG Lumbar Spine 2-3 Views (Completed)   POCT HgB A1C   POCT Urinalysis Dipstick (Completed)   Current smoker     Cardiovascular and cancer risks associated with smoking discussed Smoking cessation advice given.      Patient Instructions  Hipertensin en los adultos Hypertension, Adult El trmino hipertensin es otra forma de denominar a la presin arterial elevada. La presin arterial elevada fuerza al corazn a trabajar ms para bombear la sangre. Esto puede causar problemas con el paso del [place]. Una lectura de presin arterial est compuesta por 2 nmeros. Hay un nmero superior (sistlico) sobre un nmero inferior (diastlico). Lo ideal es tener la presin arterial por debajo de 120/80. Cules son las causas? Se desconoce la causa de esta afeccin. Algunas otras afecciones pueden provocar presin arterial elevada. Qu incrementa el riesgo? Algunos factores del estilo de vida pueden hacer que tenga ms probabilidades de desarrollar presin arterial elevada: Fumar. No hacer la cantidad suficiente de actividad fsica o ejercicio. Tener sobrepeso. Consumir mucha grasa, azcar, caloras o sal (sodio) en su dieta. Beber alcohol en exceso. Otros factores de riesgo son los siguientes: Tener alguna de estas afecciones: Enfermedad cardaca. Diabetes. Colesterol alto. Enfermedad renal. Apnea obstructiva del sueo. Tener antecedentes familiares de presin arterial elevada y colesterol elevado. Edad. El riesgo aumenta con la edad. Estrs. Cules son los signos o sntomas? Es posible que la presin arterial alta no cause sntomas. La presin arterial muy alta (crisis hipertensiva) puede provocar: Dolor de cabeza. Latidos cardacos acelerados o irregulares (palpitaciones). Falta de aire. Hemorragia nasal. Vomitar o sentir ganas de vomitar (nuseas). Cambios en la forma de ver. Dolor muy intenso en el pecho. Sensacin de [employer]. Convulsiones. Cmo se trata? Esta afeccin se trata haciendo cambios saludables en el estilo de vida, por ejemplo: Consumir alimentos saludables. Hacer ms  ejercicio. Beber menos alcohol. El mdico puede recetarle medicamentos si los cambios en el estilo de vida no son lo suficientemente eficaces y si: El nmero de arriba est por encima de 130. El nmero de abajo est por encima de 80. Su presin arterial personal ideal puede variar. Siga estas indicaciones en su casa: Comida y bebida  Si se lo dicen, siga el plan de alimentacin de DASH (Dietary Approaches to Stop Hypertension, Maneras de alimentarse para detener la hipertensin). Para seguir este plan: Llene la mitad del plato de cada comida con frutas y verduras. Llene un cuarto del plato de cada comida con cereales integrales. Los cereales integrales incluyen pasta integral, arroz integral y pan integral. Coma y beba productos lcteos con bajo contenido de grasa, como leche descremada o yogur bajo en grasas. Llene un cuarto del plato de cada comida con protenas bajas en grasa (magras). Las protenas bajas en grasa incluyen pescado, pollo sin piel, huevos, frijoles y tofu. Evite consumir carne grasa, carne curada y procesada, o pollo con piel. Evite consumir alimentos prehechos o procesados. Limite la cantidad de sal en su dieta a menos de 1500 mg por da. No beba alcohol si: El mdico le indica que no lo haga. Est embarazada, puede estar embarazada o est tratando de [place]. Si bebe alcohol: Limite la cantidad que bebe a lo siguiente: De 0 a 1 medida por da para las mujeres. De 0 a 2 medidas por da para los hombres. Sepa cunta cantidad de alcohol hay en las bebidas que toma. En los [address], una medida equivale a una botella de cerveza de 12 oz (355 ml), un vaso de vino de 5 oz (148 ml) o un vaso de una bebida alcohlica de alta graduacin de 1 oz (44 ml). Estilo de vida  Trabaje con su mdico para mantenerse en un peso saludable o para perder peso. Pregntele a su mdico cul es el peso recomendable para  usted. Realice al menos 30 minutos de ejercicio que haga  que se acelere su corazn (ejercicio Magazine features editor) la [employer] de la [place]. Estos pueden incluir caminar, nadar o andar en bicicleta. Realice al menos 30 minutos de ejercicio que fortalezca sus msculos (ejercicios de resistencia) al menos 3 das a la [place]. Estos pueden incluir levantar pesas o hacer Pilates. No fume ni consuma ningn producto que contenga nicotina o tabaco. Si necesita ayuda para dejar de consumir estos productos, consulte al mdico. Controle su presin arterial en su casa tal como le indic el mdico. Concurra a todas las visitas de seguimiento. Medicamentos Use los medicamentos de venta libre y los recetados solamente como se lo haya indicado el mdico. Siga cuidadosamente las indicaciones. No omita las dosis de medicamentos para la presin arterial. Los medicamentos pierden eficacia si omite dosis. El hecho de omitir las dosis tambin [place] el riesgo de otros problemas. Pregntele a su mdico a qu efectos secundarios o reacciones a los Museum/gallery curator. Comunquese con un mdico si: Piensa que tiene [place] reaccin a los medicamentos que est tomando. Tiene dolores de cabeza frecuentes. Siente mareos. Tiene hinchazn en los tobillos. Tiene problemas de visin. Solicite ayuda de inmediato si: Siente un dolor de cabeza muy intenso. Empieza a sentirse desorientado (confundido). Se siente dbil o adormecido. Siente que va a desmayarse. Tiene un dolor muy intenso en: Pecho. Vientre (abdomen). Vomita ms de una vez. Tiene dificultad para respirar. Estos sntomas pueden Customer service manager. Solicite ayuda de inmediato. Llame al 911. No espere a ver si los sntomas desaparecen. No conduzca por sus propios medios [employer]. Resumen El trmino hipertensin es otra forma de denominar a la presin arterial elevada. La presin arterial elevada fuerza al corazn a trabajar ms para bombear la sangre. Para la [employer], una  presin arterial normal es menor que 120/80. Las decisiones saludables pueden ayudarle a disminuir su presin arterial. Si no puede bajar su presin arterial mediante decisiones saludables, es posible que deba tomar medicamentos. Esta informacin no tiene Theme park manager el consejo del mdico. Asegrese de hacerle al mdico cualquier pregunta que tenga. Document Revised: 01/13/2021 Document Reviewed: 01/13/2021 Elsevier Patient Education  2023 Elsevier Inc.    Edwina Barth, MD [facility]

## 2022-08-03 NOTE — Assessment & Plan Note (Signed)
Elevated blood pressure readings at home and in the office Recommend to start valsartan HCT 160-12.5 mg daily Cardiovascular risks associated with uncontrolled hypertension discussed Dietary approaches to stop hypertension discussed Smoking cessation advice given Benefits of exercise discussed Advised to monitor blood pressure readings at home daily for the next several weeks and keep a log.  Advised to contact the office if numbers persistently abnormal.  Will follow-up in 4 weeks

## 2022-08-03 NOTE — Assessment & Plan Note (Signed)
Unremarkable x-ray report Mechanical in nature Physical examination.  No red flag signs or symptoms. Recommend Tylenol as needed for pain Advised to avoid NSAIDs

## 2022-08-03 NOTE — Patient Instructions (Signed)
Hipertensin en los adultos Hypertension, Adult El trmino hipertensin es otra forma de denominar a la presin arterial elevada. La presin arterial elevada fuerza al corazn a trabajar ms para bombear la sangre. Esto puede causar problemas con el paso del tiempo. Una lectura de presin arterial est compuesta por 2 nmeros. Hay un nmero superior (sistlico) sobre un nmero inferior (diastlico). Lo ideal es tener la presin arterial por debajo de 120/80. Cules son las causas? Se desconoce la causa de esta afeccin. Algunas otras afecciones pueden provocar presin arterial elevada. Qu incrementa el riesgo? Algunos factores del estilo de vida pueden hacer que tenga ms probabilidades de desarrollar presin arterial elevada: Fumar. No hacer la cantidad suficiente de actividad fsica o ejercicio. Tener sobrepeso. Consumir mucha grasa, azcar, caloras o sal (sodio) en su dieta. Beber alcohol en exceso. Otros factores de riesgo son los siguientes: Tener alguna de estas afecciones: Enfermedad cardaca. Diabetes. Colesterol alto. Enfermedad renal. Apnea obstructiva del sueo. Tener antecedentes familiares de presin arterial elevada y colesterol elevado. Edad. El riesgo aumenta con la edad. Estrs. Cules son los signos o sntomas? Es posible que la presin arterial alta no cause sntomas. La presin arterial muy alta (crisis hipertensiva) puede provocar: Dolor de cabeza. Latidos cardacos acelerados o irregulares (palpitaciones). Falta de aire. Hemorragia nasal. Vomitar o sentir ganas de vomitar (nuseas). Cambios en la forma de ver. Dolor muy intenso en el pecho. Sensacin de mareo. Convulsiones. Cmo se trata? Esta afeccin se trata haciendo cambios saludables en el estilo de vida, por ejemplo: Consumir alimentos saludables. Hacer ms ejercicio. Beber menos alcohol. El mdico puede recetarle medicamentos si los cambios en el estilo de vida no son lo suficientemente  eficaces y si: El nmero de arriba est por encima de 130. El nmero de abajo est por encima de 80. Su presin arterial personal ideal puede variar. Siga estas indicaciones en su casa: Comida y bebida  Si se lo dicen, siga el plan de alimentacin de DASH (Dietary Approaches to Stop Hypertension, Maneras de alimentarse para detener la hipertensin). Para seguir este plan: Llene la mitad del plato de cada comida con frutas y verduras. Llene un cuarto del plato de cada comida con cereales integrales. Los cereales integrales incluyen pasta integral, arroz integral y pan integral. Coma y beba productos lcteos con bajo contenido de grasa, como leche descremada o yogur bajo en grasas. Llene un cuarto del plato de cada comida con protenas bajas en grasa (magras). Las protenas bajas en grasa incluyen pescado, pollo sin piel, huevos, frijoles y tofu. Evite consumir carne grasa, carne curada y procesada, o pollo con piel. Evite consumir alimentos prehechos o procesados. Limite la cantidad de sal en su dieta a menos de 1500 mg por da. No beba alcohol si: El mdico le indica que no lo haga. Est embarazada, puede estar embarazada o est tratando de quedar embarazada. Si bebe alcohol: Limite la cantidad que bebe a lo siguiente: De 0 a 1 medida por da para las mujeres. De 0 a 2 medidas por da para los hombres. Sepa cunta cantidad de alcohol hay en las bebidas que toma. En los [place], una medida equivale a una botella de cerveza de 12 oz (355 ml), un vaso de vino de 5 oz (148 ml) o un vaso de una bebida alcohlica de alta graduacin de 1 oz (44 ml). Estilo de vida  Trabaje con su mdico para mantenerse en un peso saludable o para perder peso. Pregntele a su mdico cul es el peso recomendable para   usted. Realice al menos 30 minutos de ejercicio que haga que se acelere su corazn (ejercicio aerbico) la mayora de los das de la semana. Estos pueden incluir caminar, nadar o andar en  bicicleta. Realice al menos 30 minutos de ejercicio que fortalezca sus msculos (ejercicios de resistencia) al menos 3 das a la semana. Estos pueden incluir levantar pesas o hacer Pilates. No fume ni consuma ningn producto que contenga nicotina o tabaco. Si necesita ayuda para dejar de consumir estos productos, consulte al mdico. Controle su presin arterial en su casa tal como le indic el mdico. Concurra a todas las visitas de seguimiento. Medicamentos Use los medicamentos de venta libre y los recetados solamente como se lo haya indicado el mdico. Siga cuidadosamente las indicaciones. No omita las dosis de medicamentos para la presin arterial. Los medicamentos pierden eficacia si omite dosis. El hecho de omitir las dosis tambin aumenta el riesgo de otros problemas. Pregntele a su mdico a qu efectos secundarios o reacciones a los medicamentos debe prestar atencin. Comunquese con un mdico si: Piensa que tiene una reaccin a los medicamentos que est tomando. Tiene dolores de cabeza frecuentes. Siente mareos. Tiene hinchazn en los tobillos. Tiene problemas de visin. Solicite ayuda de inmediato si: Siente un dolor de cabeza muy intenso. Empieza a sentirse desorientado (confundido). Se siente dbil o adormecido. Siente que va a desmayarse. Tiene un dolor muy intenso en: Pecho. Vientre (abdomen). Vomita ms de una vez. Tiene dificultad para respirar. Estos sntomas pueden indicar una emergencia. Solicite ayuda de inmediato. Llame al 911. No espere a ver si los sntomas desaparecen. No conduzca por sus propios medios hasta el hospital. Resumen El trmino hipertensin es otra forma de denominar a la presin arterial elevada. La presin arterial elevada fuerza al corazn a trabajar ms para bombear la sangre. Para la mayora de las personas, una presin arterial normal es menor que 120/80. Las decisiones saludables pueden ayudarle a disminuir su presin arterial. Si no puede  bajar su presin arterial mediante decisiones saludables, es posible que deba tomar medicamentos. Esta informacin no tiene como fin reemplazar el consejo del mdico. Asegrese de hacerle al mdico cualquier pregunta que tenga. Document Revised: 01/13/2021 Document Reviewed: 01/13/2021 Elsevier Patient Education  2023 Elsevier Inc.  

## 2022-08-04 LAB — LIPID PANEL
Cholesterol: 228 mg/dL — ABNORMAL HIGH (ref 0–200)
HDL: 37.4 mg/dL — ABNORMAL LOW (ref >39.00)
NonHDL: 190.1
Total CHOL/HDL Ratio: 6
Triglycerides: 371 mg/dL — ABNORMAL HIGH (ref 0.0–149.0)
VLDL: 74.2 mg/dL — ABNORMAL HIGH (ref 0.0–40.0)

## 2022-08-04 LAB — CBC WITH DIFFERENTIAL/PLATELET
Basophils Absolute: 0.1 10*3/uL (ref 0.0–0.1)
Basophils Relative: 0.6 % (ref 0.0–3.0)
Eosinophils Absolute: 0.2 10*3/uL (ref 0.0–0.7)
Eosinophils Relative: 2.4 % (ref 0.0–5.0)
HCT: 49.7 % (ref 39.0–52.0)
Hemoglobin: 16.7 g/dL (ref 13.0–17.0)
Lymphocytes Relative: 32 % (ref 12.0–46.0)
Lymphs Abs: 3.1 10*3/uL (ref 0.7–4.0)
MCHC: 33.7 g/dL (ref 30.0–36.0)
MCV: 85.5 fl (ref 78.0–100.0)
Monocytes Absolute: 0.8 10*3/uL (ref 0.1–1.0)
Monocytes Relative: 8.2 % (ref 3.0–12.0)
Neutro Abs: 5.4 10*3/uL (ref 1.4–7.7)
Neutrophils Relative %: 56.8 % (ref 43.0–77.0)
Platelets: 280 10*3/uL (ref 150.0–400.0)
RBC: 5.81 Mil/uL (ref 4.22–5.81)
RDW: 14 % (ref 11.5–15.5)
WBC: 9.5 10*3/uL (ref 4.0–10.5)

## 2022-08-04 LAB — LDL CHOLESTEROL, DIRECT: Direct LDL: 156 mg/dL

## 2022-08-04 LAB — COMPREHENSIVE METABOLIC PANEL
ALT: 23 U/L (ref 0–53)
AST: 23 U/L (ref 0–37)
Albumin: 4 g/dL (ref 3.5–5.2)
Alkaline Phosphatase: 71 U/L (ref 39–117)
BUN: 9 mg/dL (ref 6–23)
CO2: 30 mEq/L (ref 19–32)
Calcium: 9.5 mg/dL (ref 8.4–10.5)
Chloride: 102 mEq/L (ref 96–112)
Creatinine, Ser: 0.8 mg/dL (ref 0.40–1.50)
GFR: 97.57 mL/min (ref >60.00)
Glucose, Bld: 117 mg/dL — ABNORMAL HIGH (ref 70–99)
Potassium: 4 mEq/L (ref 3.5–5.1)
Sodium: 138 mEq/L (ref 135–145)
Total Bilirubin: 0.3 mg/dL (ref 0.2–1.2)
Total Protein: 7.1 g/dL (ref 6.0–8.3)

## 2022-08-04 LAB — HEMOGLOBIN A1C: Hgb A1c MFr Bld: 5.9 % (ref 4.6–6.5)

## 2022-08-07 NOTE — Progress Notes (Signed)
Thank you :)

## 2023-01-22 ENCOUNTER — Emergency Department (HOSPITAL_COMMUNITY)
Admission: EM | Admit: 2023-01-22 | Discharge: 2023-01-22 | Disposition: A | Discharge status: Home / Self Care | Payer: BC Managed Care – PPO | Attending: Emergency Medicine | Admitting: Emergency Medicine

## 2023-01-22 ENCOUNTER — Emergency Department (HOSPITAL_COMMUNITY): Payer: BC Managed Care – PPO

## 2023-01-22 ENCOUNTER — Other Ambulatory Visit: Payer: Self-pay

## 2023-01-22 DIAGNOSIS — M79602 Pain in left arm: Secondary | ICD-10-CM | POA: Diagnosis not present

## 2023-01-22 DIAGNOSIS — I10 Essential (primary) hypertension: Secondary | ICD-10-CM | POA: Diagnosis not present

## 2023-01-22 DIAGNOSIS — M25512 Pain in left shoulder: Secondary | ICD-10-CM | POA: Diagnosis not present

## 2023-01-22 DIAGNOSIS — Z79899 Other long term (current) drug therapy: Secondary | ICD-10-CM | POA: Diagnosis not present

## 2023-01-22 DIAGNOSIS — J449 Chronic obstructive pulmonary disease, unspecified: Secondary | ICD-10-CM | POA: Diagnosis not present

## 2023-01-22 DIAGNOSIS — M19012 Primary osteoarthritis, left shoulder: Secondary | ICD-10-CM | POA: Diagnosis not present

## 2023-01-22 MED ORDER — KETOROLAC TROMETHAMINE 15 MG/ML IJ SOLN
15.0000 mg | Freq: Once | INTRAMUSCULAR | Status: AC
  Administered 2023-01-22: 15 mg via INTRAMUSCULAR
  Filled 2023-01-22: qty 1

## 2023-01-22 MED ORDER — IRBESARTAN 300 MG PO TABS
150.0000 mg | ORAL_TABLET | Freq: Every day | ORAL | Status: DC
  Administered 2023-01-22: 150 mg via ORAL
  Filled 2023-01-22: qty 1

## 2023-01-22 MED ORDER — HYDROCHLOROTHIAZIDE 12.5 MG PO TABS
12.5000 mg | ORAL_TABLET | Freq: Every day | ORAL | Status: DC
  Administered 2023-01-22: 12.5 mg via ORAL
  Filled 2023-01-22: qty 1

## 2023-01-22 NOTE — Discharge Instructions (Signed)
Today you were seen for left shoulder pain.  Please read the attached instructions.  If your symptoms persist please schedule an appointment with your PCP or orthopedics for further evaluation.  Thank you for letting us treat you today. After performing a physical exam reviewing your imaging, I feel you are safe to go home. Please follow up with your PCP in the next several days and provide them with your records from this visit. Return to the Emergency Room if pain becomes severe or symptoms worsen.

## 2023-01-22 NOTE — ED Triage Notes (Signed)
Patient arrives for L shoulder pain for approximately one week but worse for three days. Taking otc and using diclofenac gel without relief. Does report lifting up some heavy filters. Denies chest or back pain.

## 2023-01-22 NOTE — ED Provider Notes (Signed)
[place] EMERGENCY DEPARTMENT AT [facility] Provider Note   CSN: [id number] Arrival date & time: 01/22/23  2956     History  Chief Complaint  Patient presents with   Arm Pain    Scott Hanna is a 57 y.o. male past medical history significant for hypertension, musculoskeletal back pain, dyslipidemia, COPD, thoracic aortic aneurysm without rupture presents today for left shoulder pain for approximately 1 week.  Patient states it has gotten worse over the past 3 days.  Patient states he has taken ibuprofen 600 mg and has been using diclofenac gel without relief.  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  Patient denies chest pain, back pain, or numbness.  Patient endorses pain that shoots from his deltoid to his left thumb.  History obtained using certified professional interpreter.   Arm Pain       Home Medications Prior to Admission medications   Medication Sig Start Date End Date Taking? Authorizing Provider  acetaminophen (TYLENOL) 500 MG tablet Take 1,000 mg by mouth every 6 (six) hours as needed for moderate pain or headache. Patient not taking: Reported on 08/03/2022    [provider]  ferrous sulfate 325 (65 FE) MG tablet Take 1 tablet (325 mg total) by mouth daily. 05/12/21 05/12/22  Regalado, Belkys A, MD  MELATONIN GUMMIES PO Take 20 mg by mouth at bedtime as needed (sleep).    [provider]  Multiple Vitamins-Minerals (CERTAVITE/ANTIOXIDANTS) TABS Take 1 tablet by mouth daily. 05/13/21   Regalado, Belkys A, MD  pantoprazole (PROTONIX) 40 MG tablet Take 1 tablet (40 mg total) by mouth 2 (two) times daily. Patient not taking: Reported on 08/03/2022 05/12/21   Regalado, Jon Billings A, MD  rosuvastatin (CRESTOR) 20 MG tablet Take 1 tablet (20 mg total) by mouth daily. Patient not taking: Reported on 08/03/2022 08/19/20   Georgina Quint, MD  sildenafil (VIAGRA) 100  MG tablet Take 0.5-1 tablets (50-100 mg total) by mouth daily as needed for erectile dysfunction. 11/30/21   Georgina Quint, MD  Testosterone 1.62 % GEL Place 1 Application onto the skin daily. Patient not taking: Reported on 08/03/2022 12/06/21   Georgina Quint, MD  traZODone (DESYREL) 50 MG tablet TAKE 1/2 TO 1 (ONE-HALF TO ONE) TABLET BY MOUTH AT BEDTIME AS NEEDED FOR SLEEP Patient not taking: Reported on 08/03/2022 07/08/21   Georgina Quint, MD  valsartan-hydrochlorothiazide (DIOVAN-HCT) 160-12.5 MG tablet Take 1 tablet by mouth daily. 08/03/22   Georgina Quint, MD  vitamin B-12 (CYANOCOBALAMIN) 250 MCG tablet Take 1 tablet (250 mcg total) by mouth daily. Patient not taking: Reported on 08/03/2022 05/13/21   Alba Cory, MD      Allergies    Patient has no known allergies.    Review of Systems   Review of Systems  Musculoskeletal:  Positive for arthralgias.    Physical Exam Updated Vital Signs BP (!) 147/103   Pulse 85   Temp 98.3 F (36.8 C) (Oral)   Resp 17   SpO2 100%  Physical Exam Vitals and nursing note reviewed.  Constitutional:      General: He is not in acute distress.    Appearance: He is well-developed.  HENT:     Head: Normocephalic and atraumatic.     Right Ear: External ear normal.     Left Ear: External ear normal.  Eyes:     Conjunctiva/sclera: Conjunctivae normal.  Cardiovascular:     Rate and Rhythm: Normal rate and regular rhythm.     Heart sounds: No murmur heard. Pulmonary:     Effort: Pulmonary effort is normal. No respiratory distress.     Breath sounds: Normal breath sounds.  Abdominal:     Palpations: Abdomen is soft.     Tenderness: There is no abdominal tenderness.  Musculoskeletal:        General: No swelling.     Left shoulder: Tenderness and bony tenderness present. No swelling, deformity or effusion.       Arms:     Cervical back: Neck supple.     Comments: Range of motion decreased due to pain.   Neurovascularly intact.  Equal 5 out of 5 grip strength bilaterally.  No tenderness to palpation over clavicle  Skin:    General: Skin is warm and dry.     Capillary Refill: Capillary refill takes less than 2 seconds.  Neurological:     General: No focal deficit present.     Mental Status: He is alert.  Psychiatric:        Mood and Affect: Mood normal.     ED Results / Procedures / Treatments   Labs (all labs ordered are listed, but only abnormal results are displayed) Labs Reviewed - No data to display  EKG None  Radiology DG Shoulder Left  Result Date: 01/22/2023 CLINICAL DATA:  Worsening left shoulder pain over the last week EXAM: LEFT SHOULDER - 2+ VIEW COMPARISON:  None Available. FINDINGS: There is no acute osseous finding. Glenohumeral and acromioclavicular alignment is maintained. There is mild degenerative change about the [facility] joint. There is ill-defined calcification adjacent to the greater tuberosity. The soft tissues are otherwise unremarkable. There is no erosive change. IMPRESSION: 1. Ill-defined calcification about the greater tuberosity suggesting calcific tendinitis. 2. Mild degenerative change about the [facility] joint. Electronically Signed   By: Lesia Hausen M.D.   On: 01/22/2023 13:18    Procedures Procedures    Medications Ordered in ED Medications  irbesartan (AVAPRO) tablet 150 mg (150 mg Oral Given 01/22/23 1141)  hydrochlorothiazide (HYDRODIURIL) tablet 12.5 mg (12.5 mg Oral Given 01/22/23 1141)  ketorolac (TORADOL) 15 MG/ML injection 15 mg (15 mg Intramuscular Given 01/22/23 1144)    ED Course/ Medical Decision Making/ A&P                                 Medical Decision Making Amount and/or Complexity of Data Reviewed Radiology: ordered.  Risk Prescription drug management.   This patient presents to the ED with chief complaint(s) of left shoulder pain with pertinent past medical history of back pain which further complicates the presenting complaint.  The complaint involves an extensive differential diagnosis and also carries with it a high risk of complications and morbidity.    The differential diagnosis includes muscle strain, rotator cuff injury, nerve impingement  Additional history obtained: Records reviewed Primary Care Documents  ED Course and Reassessment: Patient given home blood pressure meds as he states that he did not take them this morning Patient given 15 mg Toradol for pain. Patient placed in sling  Independent visualization of imaging: - I independently visualized the following imaging with scope of interpretation limited to determining acute life threatening conditions related to emergency care: Left shoulder x-ray, which revealed Ill-defined calcification about the greater tuberosity suggesting calcific tendinitis and Mild degenerative change about the [facility] joint.  Consultation: -  Consulted or discussed management/test interpretation w/ external professional: None  Consideration for admission or further workup: Considered for admission or further workup however patient's vital signs been stable while in the ER.  Patient's physical exam and imaging has been reassuring.  Patient should follow-up with orthopedics if pain continues.        Final Clinical Impression(s) / ED Diagnoses Final diagnoses:  Left arm pain    Rx / DC Orders ED Discharge Orders     None         Dolphus Jenny, PA-C 01/22/23 1338    Margarita Grizzle, MD 01/22/23 1652

## 2023-01-22 NOTE — ED Notes (Signed)
Patient transported to X-ray 

## 2023-01-25 ENCOUNTER — Encounter: Payer: Self-pay | Admitting: Internal Medicine

## 2023-01-25 ENCOUNTER — Telehealth: Payer: Self-pay | Admitting: Emergency Medicine

## 2023-01-25 ENCOUNTER — Ambulatory Visit: Payer: BC Managed Care – PPO | Admitting: Internal Medicine

## 2023-01-25 VITALS — BP 130/82 | HR 105 | Temp 98.6°F | Ht 73.0 in | Wt 217.0 lb

## 2023-01-25 DIAGNOSIS — I10 Essential (primary) hypertension: Secondary | ICD-10-CM

## 2023-01-25 DIAGNOSIS — J410 Simple chronic bronchitis: Secondary | ICD-10-CM | POA: Diagnosis not present

## 2023-01-25 DIAGNOSIS — M7532 Calcific tendinitis of left shoulder: Secondary | ICD-10-CM

## 2023-01-25 MED ORDER — TRAMADOL HCL 50 MG PO TABS: 50.0000 mg | ORAL_TABLET | Freq: Four times a day (QID) | ORAL | 0 refills | Status: DC | PRN

## 2023-01-25 NOTE — Patient Instructions (Signed)
Please take all new medication as prescribed - the pain medication  Please continue all other medications as before, and refills have been done if requested.  Please have the pharmacy call with any other refills you may need.  Please keep your appointments with your specialists as you may have planned  You are given the work note today  Please see Sports Medicine on the first floor for an appointment for left shoulder calcific tendonitis

## 2023-01-25 NOTE — Progress Notes (Signed)
Patient ID: Scott Hanna, male   DOB: Aug 11, 1965, 57 y.o.   MRN: [id number]        Chief Complaint: follow up left shoulder pain x 2 wks       HPI:  Scott Hanna is a 57 y.o. male here with c/o above x 2 wks, now severe , hurts with any movement of the arm and keeps it to his side,and better to not use it at all. Worse to lie on that side but not overly tender to palpate.  Ibuprofen no longer working  Seen at [facility] 3 days ago with xray c/w calcific tendonitis, temporarily better with toradol, now here since pain now worse than ever.   Pt denies chest pain, increased sob or doe, wheezing, orthopnea, PND, increased LE swelling, palpitations, dizziness or syncope.   Pt denies polydipsia, polyuria, or new focal neuro s/s.          Wt Readings from Last 3 Encounters:  01/26/23 218 lb (98.9 kg)  01/25/23 217 lb (98.4 kg)  08/03/22 218 lb 4 oz (99 kg)   BP Readings from Last 3 Encounters:  01/26/23 132/84  01/25/23 130/82  01/22/23 (!) 147/103         Past Medical History:  Diagnosis Date   COPD (chronic obstructive pulmonary disease) (HCC)    GI bleed    Thoracic aortic aneurysm without rupture [facility])    Past Surgical History:  Procedure Laterality Date   BIOPSY  05/10/2021   Procedure: BIOPSY;  Surgeon: Lynann Bologna, MD;  Location: [facility] ENDOSCOPY;  Service: Gastroenterology;;   COLONOSCOPY WITH PROPOFOL N/A 05/11/2021   Procedure: COLONOSCOPY WITH PROPOFOL;  Surgeon: Lynann Bologna, MD;  Location: [facility] ENDOSCOPY;  Service: Gastroenterology;  Laterality: N/A;   ESOPHAGOGASTRODUODENOSCOPY (EGD) WITH PROPOFOL N/A 05/10/2021   Procedure: ESOPHAGOGASTRODUODENOSCOPY (EGD) WITH PROPOFOL;  Surgeon: Lynann Bologna, MD;  Location: [facility] ENDOSCOPY;  Service: Gastroenterology;  Laterality: N/A;   POLYPECTOMY  05/11/2021   Procedure: POLYPECTOMY;  Surgeon: Lynann Bologna, MD;  Location: [facility] ENDOSCOPY;  Service: Gastroenterology;;    reports that he has quit smoking. His smoking use included  cigarettes. He has never used smokeless tobacco. He reports that he does not currently use alcohol. He reports that he does not use drugs. family history is not on file. No Known Allergies Current Outpatient Medications on File Prior to Visit  Medication Sig Dispense Refill   MELATONIN GUMMIES PO Take 20 mg by mouth at bedtime as needed (sleep).     Multiple Vitamins-Minerals (CERTAVITE/ANTIOXIDANTS) TABS Take 1 tablet by mouth daily. 30 tablet 0   sildenafil (VIAGRA) 100 MG tablet Take 0.5-1 tablets (50-100 mg total) by mouth daily as needed for erectile dysfunction. 5 tablet 11   valsartan-hydrochlorothiazide (DIOVAN-HCT) 160-12.5 MG tablet Take 1 tablet by mouth daily. 90 tablet 3   acetaminophen (TYLENOL) 500 MG tablet Take 1,000 mg by mouth every 6 (six) hours as needed for moderate pain (pain score 4-6) or headache.     ferrous sulfate 325 (65 FE) MG tablet Take 1 tablet (325 mg total) by mouth daily. 30 tablet 3   pantoprazole (PROTONIX) 40 MG tablet Take 1 tablet (40 mg total) by mouth 2 (two) times daily. 30 tablet 2   rosuvastatin (CRESTOR) 20 MG tablet Take 1 tablet (20 mg total) by mouth daily. 90 tablet 3   Testosterone 1.62 % GEL Place 1 Application onto the skin daily. 75 g 3   traZODone (DESYREL) 50 MG tablet TAKE 1/2 TO 1 (ONE-HALF  TO ONE) TABLET BY MOUTH AT BEDTIME AS NEEDED FOR SLEEP 30 tablet 0   vitamin B-12 (CYANOCOBALAMIN) 250 MCG tablet Take 1 tablet (250 mcg total) by mouth daily. 30 tablet 0   No current facility-administered medications on file prior to visit.        ROS:  All others reviewed and negative.  Objective        PE:  BP 130/82 (BP Location: Right Arm, Patient Position: Sitting, Cuff Size: Normal)   Pulse (!) 105   Temp 98.6 F (37 C) (Oral)   Ht 6\' 1"  (1.854 m)   Wt 217 lb (98.4 kg)   SpO2 100%   BMI 28.63 kg/m                 Constitutional: Pt appears in NAD               HENT: Head: NCAT.                Right Ear: External ear normal.                  Left Ear: External ear normal.                Eyes: . Pupils are equal, round, and reactive to light. Conjunctivae and EOM are normal               Nose: without d/c or deformity               Neck: Neck supple. Gross normal ROM               Cardiovascular: Normal rate and regular rhythm.                 Pulmonary/Chest: Effort normal and breath sounds without rales or wheezing.                Abd:  Soft, NT, ND, + BS, no organomegaly               Neurological: Pt is alert. At baseline orientation, motor grossly intact               Skin: Skin is warm. No rashes, no other new lesions, LE edema - none;  left shoulder non tender but severe pain with any arm moveement               Psychiatric: Pt behavior is normal without agitation   Micro: none  Cardiac tracings I have personally interpreted today:  none  Pertinent Radiological findings (summarize): none   Lab Results  Component Value Date   WBC 9.5 08/03/2022   HGB 16.7 08/03/2022   HCT 49.7 08/03/2022   PLT 280.0 08/03/2022   GLUCOSE 117 (H) 08/03/2022   CHOL 228 (H) 08/03/2022   TRIG 371.0 (H) 08/03/2022   HDL 37.40 (L) 08/03/2022   LDLDIRECT 156.0 08/03/2022   LDLCALC 133 (H) 05/26/2020   ALT 23 08/03/2022   AST 23 08/03/2022   NA 138 08/03/2022   K 4.0 08/03/2022   CL 102 08/03/2022   CREATININE 0.80 08/03/2022   BUN 9 08/03/2022   CO2 30 08/03/2022   TSH 1.330 09/17/2019   HGBA1C 5.9 08/03/2022   Assessment/Plan:  Burce Hanna is a 57 y.o. Other or two or more races [6] White or Caucasian [1] male with  has a past medical history of COPD (chronic obstructive pulmonary disease) (HCC), GI bleed, and Thoracic aortic aneurysm without rupture (  HCC).  Calcific tendonitis of left shoulder Now mod to severe pain, for tramadol prn, given work note, and to see sports medicine asap, may need cortisone inj and /or PT  Uncontrolled hypertension BP Readings from Last 3 Encounters:  01/26/23 132/84   01/25/23 130/82  01/22/23 (!) 147/103   Stable, pt to continue medical treatment diovan hct 160 12.5 qd   Chronic obstructive pulmonary disease (HCC) O/w stable, cont inhaler prn  Followup: Return if symptoms worsen or fail to improve.  Oliver Barre, MD 01/27/2023 9:08 AM [facility] Internal Medicine

## 2023-01-25 NOTE — Telephone Encounter (Signed)
[facility] pharmacy called regarding prescription for traMADol (ULTRAM) 50 MG tablet , states that since it is for acute pain they can only fill it for 5 days, and states to note that in the future if it is for acute pain prescriptions should be written for 5 days.

## 2023-01-26 ENCOUNTER — Ambulatory Visit: Payer: BC Managed Care – PPO | Admitting: Sports Medicine

## 2023-01-26 VITALS — BP 132/84 | HR 131 | Ht 73.0 in | Wt 218.0 lb

## 2023-01-26 DIAGNOSIS — M25512 Pain in left shoulder: Secondary | ICD-10-CM

## 2023-01-26 DIAGNOSIS — M7532 Calcific tendinitis of left shoulder: Secondary | ICD-10-CM | POA: Diagnosis not present

## 2023-01-26 DIAGNOSIS — G8929 Other chronic pain: Secondary | ICD-10-CM

## 2023-01-26 MED ORDER — TRAMADOL HCL 50 MG PO TABS: 50.0000 mg | ORAL_TABLET | Freq: Four times a day (QID) | ORAL | 0 refills | Status: AC | PRN

## 2023-01-26 MED ORDER — MELOXICAM 15 MG PO TABS: 15.0000 mg | ORAL_TABLET | Freq: Every day | ORAL | 0 refills | Status: AC

## 2023-01-26 NOTE — Progress Notes (Signed)
Scott Hanna D.[facility] [address] [place] [address] Phone: [phone]   Assessment and Plan:     1. Calcific tendonitis of left shoulder 2. Chronic left shoulder pain  -Chronic with exacerbation, initial sports medicine visit - Most consistent with flare of calcific tendinitis of left shoulder based on HPI, physical exam, x-ray imaging - Reviewed patient's x-ray in clinic.  Calcified density at greater tuberosity consistent with calcific tendinitis of supraspinatus.  decreased space between acromion and humeral head - Start HEP for rotator cuff - Start meloxicam 15 mg daily x3 weeks.  Do not to use additional NSAIDs while taking meloxicam.  May use Tylenol [phone] mg 2 to 3 times a day for breakthrough pain. - Work note provided that patient should not lift >10 pounds for 4 weeks until reevaluated  Pertinent previous records reviewed include left shoulder x-ray 01/22/23, urgent care note 01/22/2023, internal medicine note 01/25/2023  Follow Up: 4-week for reevaluation.  If no improvement or worsening of symptoms, would consider subacromial CSI   Subjective:   I, Scott Hanna, am serving as a Neurosurgeon for Doctor Richardean Sale  Chief Complaint: left shoulder pain   HPI:   01/26/23 Patient is a 57 year old male with concerns of left shoulder pain. Patient states pain started a week ago. Acute pain isnt able to sleep through the night. Decreased ROM, has to sleep on couch. Ibu helped a little . Pain radiates down his arm. Grip is normal. No MOI   Relevant Historical Information: Hypertension, COPD  Additional pertinent review of systems negative.   Current Outpatient Medications:    acetaminophen (TYLENOL) 500 MG tablet, Take 1,000 mg by mouth every 6 (six) hours as needed for moderate pain (pain score 4-6) or headache., Disp: , Rfl:    MELATONIN GUMMIES PO, Take 20 mg by mouth at bedtime as needed (sleep)., Disp: , Rfl:     meloxicam (MOBIC) 15 MG tablet, Take 1 tablet (15 mg total) by mouth daily., Disp: 21 tablet, Rfl: 0   Multiple Vitamins-Minerals (CERTAVITE/ANTIOXIDANTS) TABS, Take 1 tablet by mouth daily., Disp: 30 tablet, Rfl: 0   pantoprazole (PROTONIX) 40 MG tablet, Take 1 tablet (40 mg total) by mouth 2 (two) times daily., Disp: 30 tablet, Rfl: 2   rosuvastatin (CRESTOR) 20 MG tablet, Take 1 tablet (20 mg total) by mouth daily., Disp: 90 tablet, Rfl: 3   sildenafil (VIAGRA) 100 MG tablet, Take 0.5-1 tablets (50-100 mg total) by mouth daily as needed for erectile dysfunction., Disp: 5 tablet, Rfl: 11   Testosterone 1.62 % GEL, Place 1 Application onto the skin daily., Disp: 75 g, Rfl: 3   traMADol (ULTRAM) 50 MG tablet, Take 1 tablet (50 mg total) by mouth every 6 (six) hours as needed., Disp: 30 tablet, Rfl: 0   traZODone (DESYREL) 50 MG tablet, TAKE 1/2 TO 1 (ONE-HALF TO ONE) TABLET BY MOUTH AT BEDTIME AS NEEDED FOR SLEEP, Disp: 30 tablet, Rfl: 0   valsartan-hydrochlorothiazide (DIOVAN-HCT) 160-12.5 MG tablet, Take 1 tablet by mouth daily., Disp: 90 tablet, Rfl: 3   vitamin B-12 (CYANOCOBALAMIN) 250 MCG tablet, Take 1 tablet (250 mcg total) by mouth daily., Disp: 30 tablet, Rfl: 0   ferrous sulfate 325 (65 FE) MG tablet, Take 1 tablet (325 mg total) by mouth daily., Disp: 30 tablet, Rfl: 3   Objective:     Vitals:   01/26/23 1419  BP: 132/84  Pulse: (!) 131  SpO2: 99%  Weight: 218  lb (98.9 kg)  Height: 6\' 1"  (1.854 m)      Body mass index is 28.76 kg/m.    Physical Exam:    Gen: Appears well, nad, nontoxic and pleasant Neuro:sensation intact, strength is 5/5 with df/pf/inv/ev, muscle tone wnl Skin: no suspicious lesion or defmority Psych: A&O, appropriate mood and affect  Left shoulder:  No deformity, swelling or muscle wasting No scapular winging FF  80, abd  80, int 30, ext 70 TTP deltoid NTTP over the Payne, clavicle, ac, coracoid, biceps groove, humerus,  , trapezius, cervical  spine Special testing limited due to reduced ROM Neg  sulcus sign,   Negative Spurling's test bilat FROM of neck    Electronically signed by:  Scott Hanna D.[facility] 2:44 PM 01/26/23

## 2023-01-26 NOTE — Patient Instructions (Addendum)
Good to see you  - Start meloxicam 15 mg daily x3 weeks.  Do not to use additional NSAIDs while taking meloxicam.  May use Tylenol [phone] mg 2 to 3 times a day for breakthrough pain. Rotator cuff exercises given Do not use Ibuprofen Follow up in four weeks

## 2023-01-26 NOTE — Telephone Encounter (Signed)
Done erx 

## 2023-01-27 ENCOUNTER — Encounter: Payer: Self-pay | Admitting: Internal Medicine

## 2023-01-27 NOTE — Assessment & Plan Note (Signed)
BP Readings from Last 3 Encounters:  01/26/23 132/84  01/25/23 130/82  01/22/23 (!) 147/103   Stable, pt to continue medical treatment diovan hct 160 12.5 qd

## 2023-01-27 NOTE — Assessment & Plan Note (Signed)
Now mod to severe pain, for tramadol prn, given work note, and to see sports medicine asap, may need cortisone inj and /or PT

## 2023-01-27 NOTE — Assessment & Plan Note (Signed)
O/w stable, cont inhaler prn

## 2023-02-02 ENCOUNTER — Other Ambulatory Visit: Payer: Self-pay | Admitting: Emergency Medicine

## 2023-02-02 DIAGNOSIS — E785 Hyperlipidemia, unspecified: Secondary | ICD-10-CM

## 2023-02-06 ENCOUNTER — Telehealth: Payer: Self-pay

## 2023-02-06 NOTE — Telephone Encounter (Signed)
Transition Care Management Unsuccessful Follow-up Telephone Call  Date of discharge and from where:  [facility] 11/4  Attempts:  1st Attempt  Reason for unsuccessful TCM follow-up call:  No answer/busy   Lenard Forth [place]  [facility], [facility] Guide, Phone: [phone] Website: [email or web address]

## 2023-02-06 NOTE — Telephone Encounter (Signed)
Transition Care Management Unsuccessful Follow-up Telephone Call  Date of discharge and from where:  [facility] 11/19  Attempts:  2nd Attempt  Reason for unsuccessful TCM follow-up call:  No answer/busy   Lenard Forth   [facility], [facility] Guide, Phone: [phone] Website: [email or web address]

## 2023-02-22 NOTE — Progress Notes (Signed)
[facility] [address] [place] [address] Phone: [phone]   Assessment and Plan:     1. Calcific tendonitis of left shoulder 2. Chronic left shoulder pain  -Chronic with exacerbation, subsequent visit - Significant improvement in flare of calcific tendinitis of left shoulder after completing 3-week course of meloxicam - Discontinue meloxicam and may use remainder as needed for breakthrough pain - Recommend continuing HEP 1-2 times a week to prevent recurrence of pain - Cleared to return to work and physical activity without restrictions.  Updated work note provided  Pertinent previous records reviewed include none  Follow Up: As needed if no improvement or worsening of symptoms.  Could consider repeat NSAID course versus subacromial CSI   Subjective:   I, Scott Hanna, am serving as a Neurosurgeon for Doctor Richardean Sale   Chief Complaint: left shoulder pain    HPI:    01/26/23 Patient is a 57 year old male with concerns of left shoulder pain. Patient states pain started a week ago. Acute pain isnt able to sleep through the night. Decreased ROM, has to sleep on couch. Ibu helped a little . Pain radiates down his arm. Grip is normal. No MOI   02/23/2023 Patient states that his shoulder feels better. [place]'s going [place] of country Tuesday and would like a refill of his blood pressure medication. I let him know to speak with sagardia   Relevant Historical Information: Hypertension, COPD  Additional pertinent review of systems negative.   Current Outpatient Medications:    acetaminophen (TYLENOL) 500 MG tablet, Take 1,000 mg by mouth every 6 (six) hours as needed for moderate pain (pain score 4-6) or headache., Disp: , Rfl:    MELATONIN GUMMIES PO, Take 20 mg by mouth at bedtime as needed (sleep)., Disp: , Rfl:    meloxicam (MOBIC) 15 MG tablet, Take 1 tablet (15 mg total) by mouth daily., Disp: 21 tablet, Rfl: 0    Multiple Vitamins-Minerals (CERTAVITE/ANTIOXIDANTS) TABS, Take 1 tablet by mouth daily., Disp: 30 tablet, Rfl: 0   pantoprazole (PROTONIX) 40 MG tablet, Take 1 tablet (40 mg total) by mouth 2 (two) times daily., Disp: 30 tablet, Rfl: 2   rosuvastatin (CRESTOR) 20 MG tablet, Take 1 tablet by mouth once daily, Disp: 90 tablet, Rfl: 0   sildenafil (VIAGRA) 100 MG tablet, Take 0.5-1 tablets (50-100 mg total) by mouth daily as needed for erectile dysfunction., Disp: 5 tablet, Rfl: 11   Testosterone 1.62 % GEL, Place 1 Application onto the skin daily., Disp: 75 g, Rfl: 3   traMADol (ULTRAM) 50 MG tablet, Take 1 tablet (50 mg total) by mouth every 6 (six) hours as needed., Disp: 20 tablet, Rfl: 0   traZODone (DESYREL) 50 MG tablet, TAKE 1/2 TO 1 (ONE-HALF TO ONE) TABLET BY MOUTH AT BEDTIME AS NEEDED FOR SLEEP, Disp: 30 tablet, Rfl: 0   valsartan-hydrochlorothiazide (DIOVAN-HCT) 160-12.5 MG tablet, Take 1 tablet by mouth daily., Disp: 90 tablet, Rfl: 3   vitamin B-12 (CYANOCOBALAMIN) 250 MCG tablet, Take 1 tablet (250 mcg total) by mouth daily., Disp: 30 tablet, Rfl: 0   ferrous sulfate 325 (65 FE) MG tablet, Take 1 tablet (325 mg total) by mouth daily., Disp: 30 tablet, Rfl: 3   Objective:     Vitals:   02/23/23 1456  Pulse: 84  SpO2: 96%  Weight: 223 lb (101.2 kg)  Height: 6\' 1"  (1.854 m)      Body mass index is 29.42 kg/m.  Physical Exam:     Gen: Appears well, nad, nontoxic and pleasant Neuro:sensation intact, strength is 5/5 with df/pf/inv/ev, muscle tone wnl Skin: no suspicious lesion or defmority Psych: A&O, appropriate mood and affect  Left shoulder:  No deformity, swelling or muscle wasting No scapular winging FF 180, abd 180, int 0, ext 90 NTTP over the [place], clavicle, ac, coracoid, biceps groove, humerus, deltoid, trapezius, cervical spine Neg neer, hawkins, empty can, obriens, crossarm, subscap liftoff, speeds Neg ant drawer, sulcus sign, apprehension Negative Spurling's  test bilat FROM of neck    Electronically signed by:  Scott [facility] 3:22 PM 02/23/23

## 2023-02-23 ENCOUNTER — Ambulatory Visit: Payer: BC Managed Care – PPO | Admitting: Sports Medicine

## 2023-02-23 VITALS — HR 84 | Ht 73.0 in | Wt 223.0 lb

## 2023-02-23 DIAGNOSIS — M25512 Pain in left shoulder: Secondary | ICD-10-CM | POA: Diagnosis not present

## 2023-02-23 DIAGNOSIS — G8929 Other chronic pain: Secondary | ICD-10-CM | POA: Diagnosis not present

## 2023-02-23 DIAGNOSIS — M7532 Calcific tendinitis of left shoulder: Secondary | ICD-10-CM

## 2023-02-23 NOTE — Patient Instructions (Signed)
Work note provided  recommend doing home exercises 1-2 times per week  Discontinue meloxicam and use remainder as needed  Go upstairs to ask for a refill of blood pressure medicine  As needed follow up

## 2023-04-08 ENCOUNTER — Other Ambulatory Visit: Payer: Self-pay | Admitting: Sports Medicine

## 2023-07-12 NOTE — Patient Instructions (Incomplete)
       Medications changes include :   None    A referral was ordered for orthopedics - hand specialist and someone will call you to schedule an appointment.

## 2023-07-12 NOTE — Progress Notes (Signed)
 Subjective:    Patient ID: Scott Hanna, male    DOB: September 28, 1965, 58 y.o.   MRN: [id number]      HPI Scott Hanna is here for  Chief Complaint  Patient presents with   Referral    Patient needs referral for hand specialist. Injury to left pointer finger    Left index finger cut 06/11/23 - cut it on a table saw.  - went to urgent care.  It was repaired with sutures.  He has pain - it is not improving.  He is still working. He keeps it wrapped daily.     Needs to see hand orthopedic.      Medications and allergies reviewed with patient and updated if appropriate.  Current Outpatient Medications on File Prior to Visit  Medication Sig Dispense Refill   acetaminophen  (TYLENOL ) 500 MG tablet Take 1,000 mg by mouth every 6 (six) hours as needed for moderate pain (pain score 4-6) or headache.     MELATONIN GUMMIES PO Take 20 mg by mouth at bedtime as needed (sleep).     meloxicam  (MOBIC ) 15 MG tablet Take 1 tablet (15 mg total) by mouth daily. 21 tablet 0   Multiple Vitamins-Minerals (CERTAVITE/ANTIOXIDANTS) TABS Take 1 tablet by mouth daily. 30 tablet 0   pantoprazole  (PROTONIX ) 40 MG tablet Take 1 tablet (40 mg total) by mouth 2 (two) times daily. 30 tablet 2   rosuvastatin  (CRESTOR ) 20 MG tablet Take 1 tablet by mouth once daily 90 tablet 0   sildenafil  (VIAGRA ) 100 MG tablet Take 0.5-1 tablets (50-100 mg total) by mouth daily as needed for erectile dysfunction. 5 tablet 11   Testosterone  1.62 % GEL Place 1 Application onto the skin daily. 75 g 3   traMADol  HCl 100 MG TABS Take 1 tablet by mouth every 6 (six) hours as needed.     traZODone  (DESYREL ) 50 MG tablet TAKE 1/2 TO 1 (ONE-HALF TO ONE) TABLET BY MOUTH AT BEDTIME AS NEEDED FOR SLEEP 30 tablet 0   valsartan -hydrochlorothiazide  (DIOVAN -HCT) 160-12.5 MG tablet Take 1 tablet by mouth daily. 90 tablet 3   vitamin B-12 (CYANOCOBALAMIN ) 250 MCG tablet Take 1 tablet (250 mcg total) by mouth daily. 30 tablet 0   ferrous sulfate   325 (65 FE) MG tablet Take 1 tablet (325 mg total) by mouth daily. 30 tablet 3   traMADol  (ULTRAM ) 50 MG tablet Take 1 tablet (50 mg total) by mouth every 6 (six) hours as needed. (Patient not taking: Reported on 07/13/2023) 20 tablet 0   No current facility-administered medications on file prior to visit.    Review of Systems     Objective:   Vitals:   07/13/23 0814  BP: 136/84  Pulse: 96  Temp: 98.1 F (36.7 C)  SpO2: 100%   BP Readings from Last 3 Encounters:  07/13/23 136/84  01/26/23 132/84  01/25/23 130/82   Wt Readings from Last 3 Encounters:  07/13/23 209 lb (94.8 kg)  02/23/23 223 lb (101.2 kg)  01/26/23 218 lb (98.9 kg)   Body mass index is 27.57 kg/m.    Physical Exam Constitutional:      General: He is not in acute distress.    Appearance: Normal appearance. He is not ill-appearing.  HENT:     Head: Normocephalic and atraumatic.  Musculoskeletal:     Comments: Left distal index finger with laceration with nail involvement.  Sutured one month ago but there is dehiscence - not healing well.  No bleeding or pus.  No erythema, sensation appears to be intact but difficult to tell  Skin:    General: Skin is warm and dry.     Findings: No erythema or rash.  Neurological:     Mental Status: He is alert.            Assessment & Plan:    Encounter Diagnosis  Name Primary?   Laceration of left index finger without foreign body with damage to nail, initial encounter Yes   Lacerated distal left index finger about one month ago - went to urgent care and it was sutured, but there is dehiscence and has not healed.  Nail is involved No evidence of infection May need surgical repair Referral to hand ortho

## 2023-07-13 ENCOUNTER — Encounter: Payer: Self-pay | Admitting: Internal Medicine

## 2023-07-13 ENCOUNTER — Ambulatory Visit: Admitting: Internal Medicine

## 2023-07-13 VITALS — BP 136/84 | HR 96 | Temp 98.1°F | Ht 73.0 in | Wt 209.0 lb

## 2023-07-13 DIAGNOSIS — S61311A Laceration without foreign body of left index finger with damage to nail, initial encounter: Secondary | ICD-10-CM | POA: Diagnosis not present

## 2023-08-19 ENCOUNTER — Other Ambulatory Visit: Payer: Self-pay | Admitting: Emergency Medicine

## 2023-08-19 DIAGNOSIS — I10 Essential (primary) hypertension: Secondary | ICD-10-CM

## 2023-12-30 IMAGING — CT CT ABD-PELV W/ CM
2 of 5 series · 16 of 46 positions shown, 18 images · IV contrast (APPLIED)
Comparison: None.

CLINICAL DATA: GI hemorrhage with anemia, initial encounter

EXAM:
CT ABDOMEN AND PELVIS WITH CONTRAST
TECHNIQUE: Multidetector CT imaging of the abdomen and pelvis was performed
using the standard protocol following bolus administration of
intravenous contrast.

[Series 3: abd/ pelvis 5.0 i30f 2 · axial · 0.93mm/px · z∈[+857,+1292]mm · 13 of 99 slices shown, 15 images]
[im 6/99  soft-tissue]
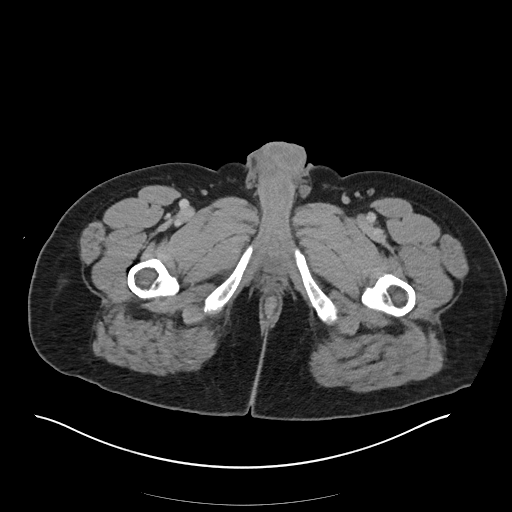
[im 6/99  bone]
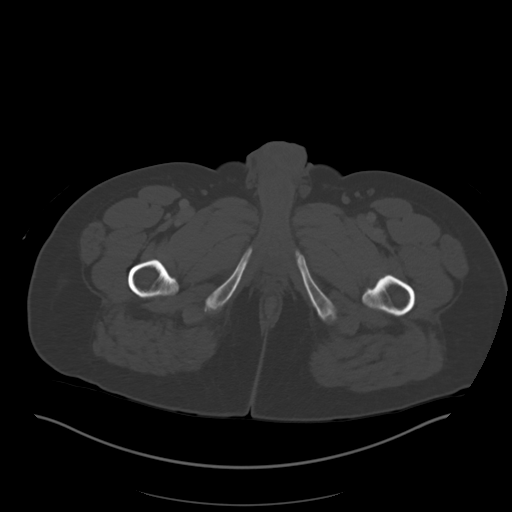
[im 11/99  soft-tissue]
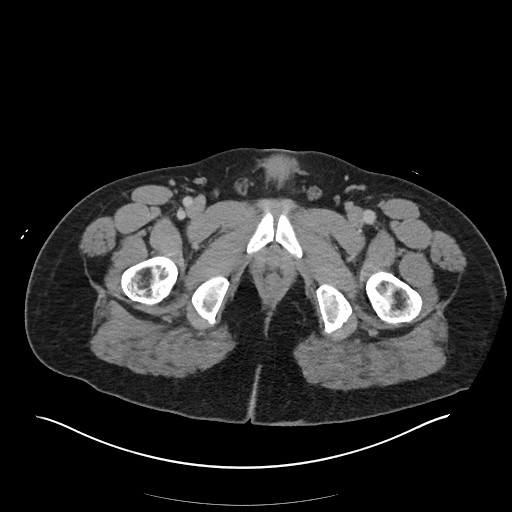
[im 22/99  soft-tissue]
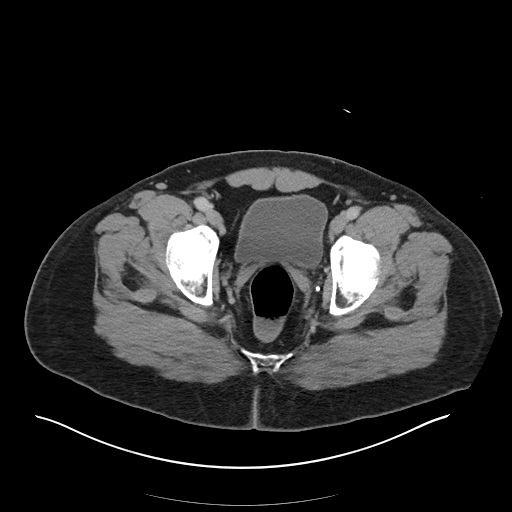
[im 28/99  soft-tissue]
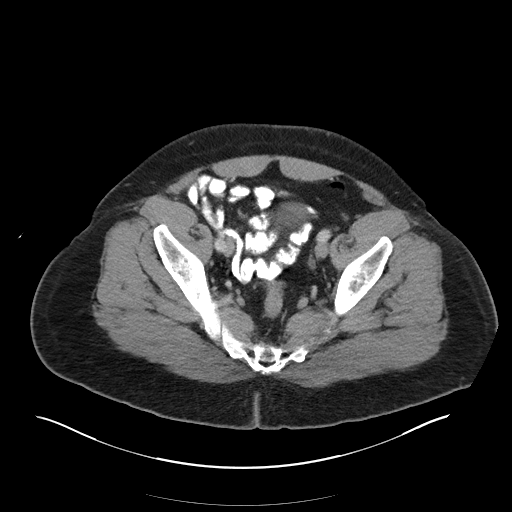
[im 33/99  soft-tissue]
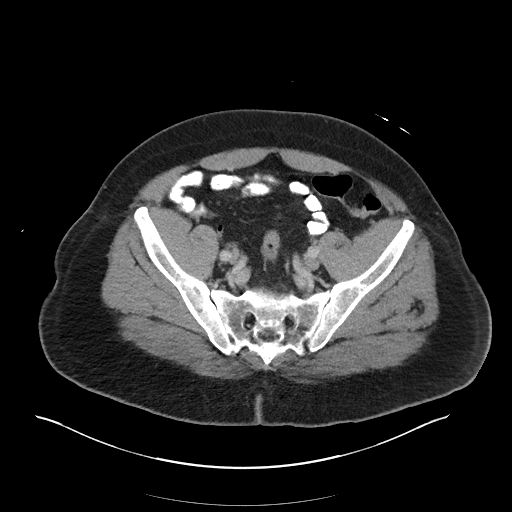
[im 44/99  soft-tissue]
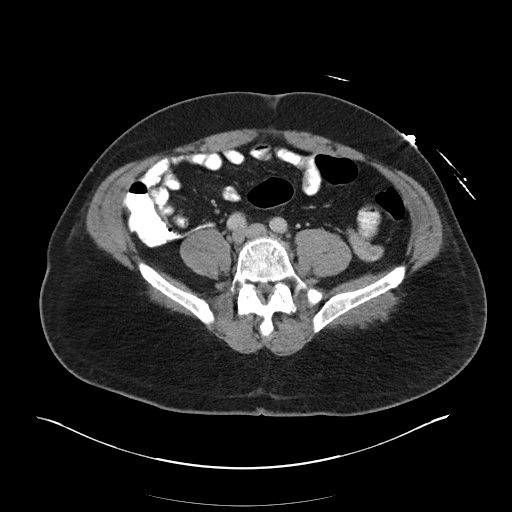
[im 50/99  soft-tissue]
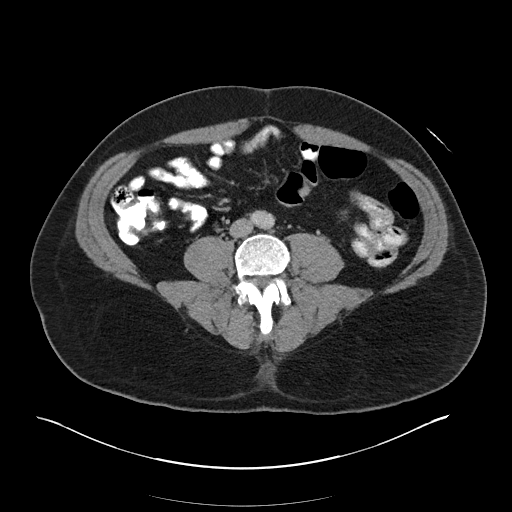
[im 55/99  soft-tissue]
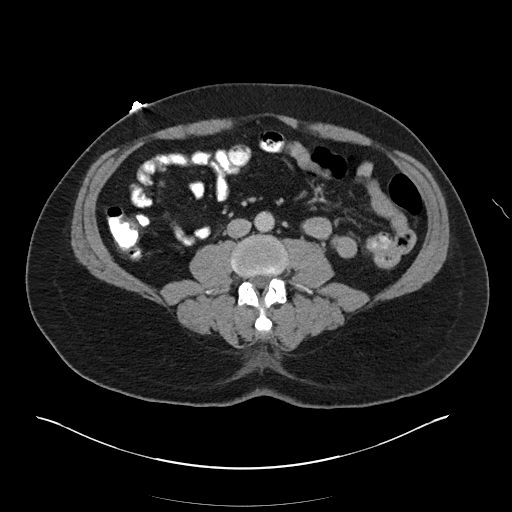
[im 66/99  soft-tissue]
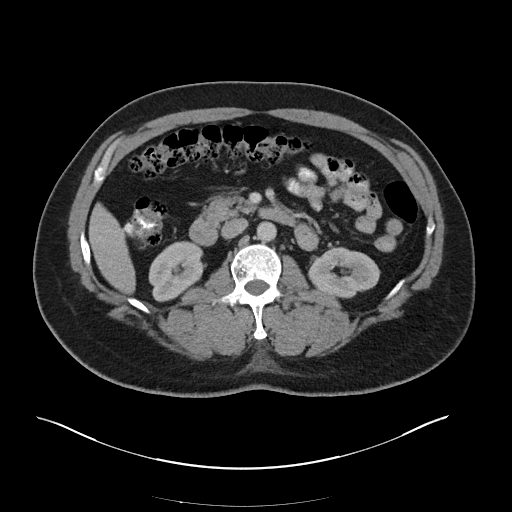
[im 66/99  bone]
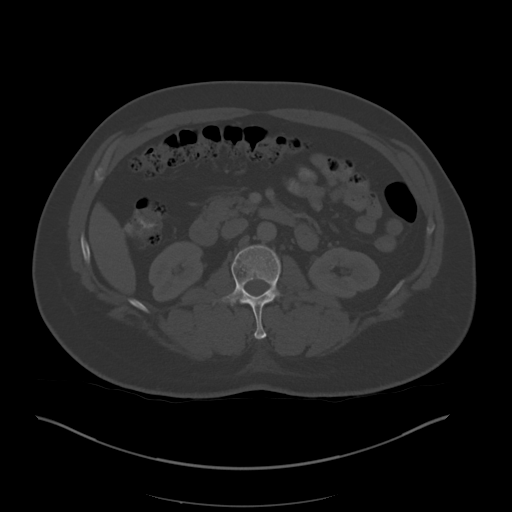
[im 71/99  soft-tissue]
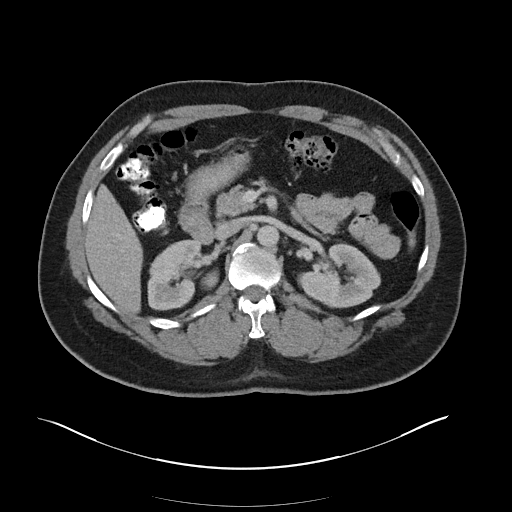
[im 77/99  soft-tissue]
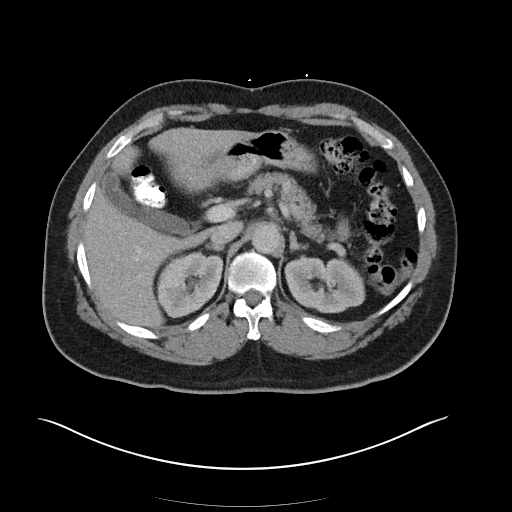
[im 88/99  soft-tissue]
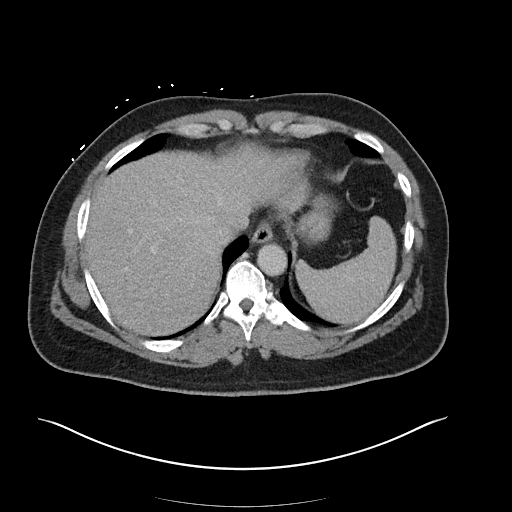
[im 93/99  soft-tissue]
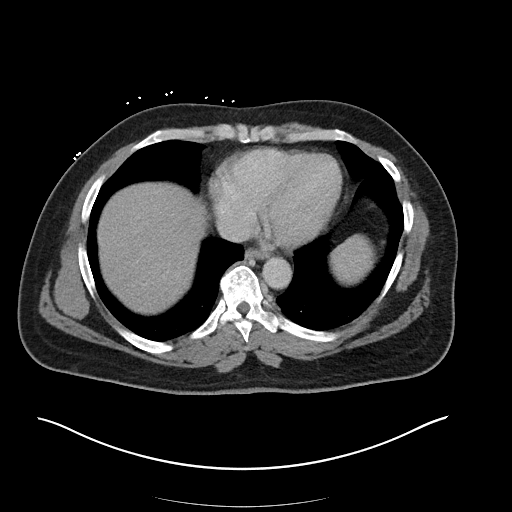

[Series 6: coronal soft tissue · coronal · 0.96mm/px · 3 of 122 slices shown]
[im 41/122  soft-tissue]
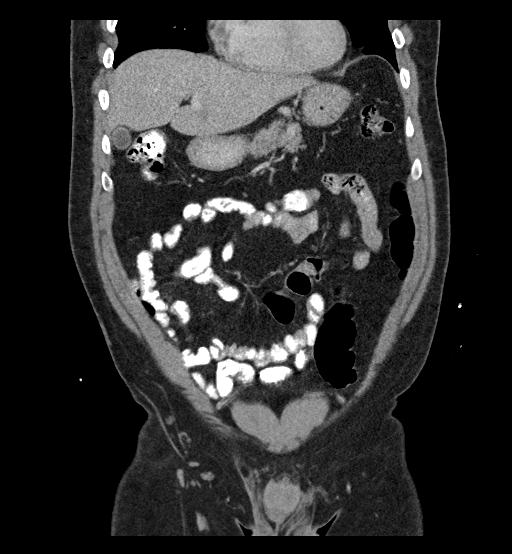
[im 54/122  soft-tissue]
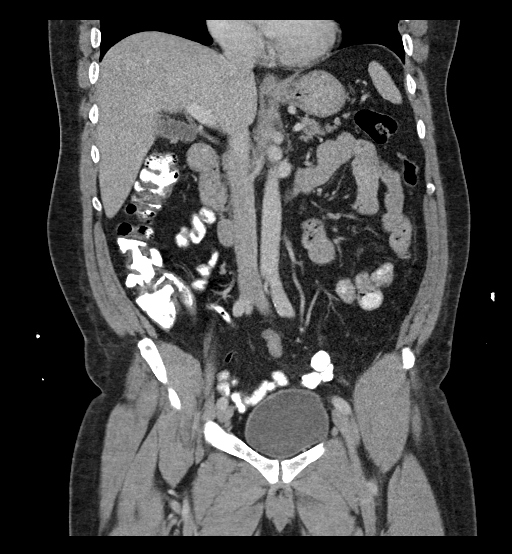
[im 68/122  soft-tissue]
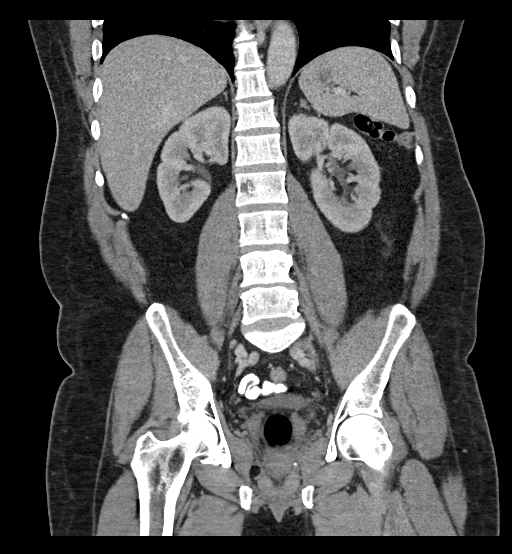

[16 of 46 positions shown; findings below may reference images not displayed]

RADIATION DOSE REDUCTION: This exam was performed according to the
departmental dose-optimization program which includes automated
exposure control, adjustment of the mA and/or kV according to
patient size and/or use of iterative reconstruction technique.

CONTRAST:  80mL OMNIPAQUE IOHEXOL 350 MG/ML SOLN
FINDINGS: Lower chest: No acute abnormality.

Hepatobiliary: No focal liver abnormality is seen. No gallstones,
gallbladder wall thickening, or biliary dilatation.

Pancreas: Unremarkable. No pancreatic ductal dilatation or
surrounding inflammatory changes.

Spleen: Normal in size without focal abnormality.

Adrenals/Urinary Tract: Adrenal glands are within normal limits.
Kidneys demonstrate a normal enhancement pattern bilaterally. No
renal calculi or obstructive changes are noted. The bladder is
partially distended.

Stomach/Bowel: No obstructive or inflammatory changes of the colon
are noted. The appendix is within normal limits. Stomach is
decompressed. Known ulcer crater is not well appreciated on this
exam. Known duodenal ulcer is not well appreciated either. Small
bowel shows no obstructive changes. No definitive filling defect is
identified. No areas of abnormal enhancement are seen.

Vascular/Lymphatic: No significant vascular findings are present. No
enlarged abdominal or pelvic lymph nodes.

Reproductive: Prostate is unremarkable.

Other: No abdominal wall hernia or abnormality. No abdominopelvic
ascites.

Musculoskeletal: No acute or significant osseous findings.
IMPRESSION: Acute abnormality is noted to correspond with the given clinical
history.
